# Patient Record
Sex: Female | Born: 1985 | Hispanic: No | Marital: Married | Smoking: Never smoker
Health system: Southern US, Community
[De-identification: ages and names within clinical notes are randomized; demographics above are authoritative.]

## PROBLEM LIST (undated history)

## (undated) ENCOUNTER — Inpatient Hospital Stay (HOSPITAL_COMMUNITY): Payer: Self-pay

## (undated) DIAGNOSIS — Z789 Other specified health status: Secondary | ICD-10-CM

## (undated) DIAGNOSIS — Z0282 Encounter for adoption services: Secondary | ICD-10-CM

## (undated) DIAGNOSIS — R112 Nausea with vomiting, unspecified: Secondary | ICD-10-CM

## (undated) DIAGNOSIS — Z9889 Other specified postprocedural states: Secondary | ICD-10-CM

## (undated) DIAGNOSIS — M1990 Unspecified osteoarthritis, unspecified site: Secondary | ICD-10-CM

## (undated) DIAGNOSIS — Z973 Presence of spectacles and contact lenses: Secondary | ICD-10-CM

## (undated) DIAGNOSIS — J302 Other seasonal allergic rhinitis: Secondary | ICD-10-CM

## (undated) DIAGNOSIS — M654 Radial styloid tenosynovitis [de Quervain]: Secondary | ICD-10-CM

## (undated) DIAGNOSIS — Z8759 Personal history of other complications of pregnancy, childbirth and the puerperium: Secondary | ICD-10-CM

## (undated) HISTORY — PX: ANKLE SURGERY: SHX546

---
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
## 2009-08-26 ENCOUNTER — Encounter: Admission: RE | Admit: 2009-08-26 | Discharge: 2009-10-01 | Payer: Self-pay | Admitting: Orthopedic Surgery

## 2009-12-12 ENCOUNTER — Emergency Department (HOSPITAL_COMMUNITY): Admission: EM | Admit: 2009-12-12 | Discharge: 2009-12-12 | Payer: Self-pay | Admitting: Emergency Medicine

## 2009-12-17 ENCOUNTER — Encounter: Admission: RE | Admit: 2009-12-17 | Discharge: 2009-12-17 | Payer: Self-pay | Admitting: Specialist

## 2009-12-19 HISTORY — PX: KNEE ARTHROSCOPY W/ MENISCECTOMY: SHX1879

## 2009-12-21 ENCOUNTER — Encounter: Admission: RE | Admit: 2009-12-21 | Discharge: 2010-01-29 | Payer: Self-pay | Admitting: Specialist

## 2011-05-06 NOTE — Op Note (Signed)
NAME:  Christina Tucker, Christina Tucker           ACCOUNT NO.:  [id number]   MEDICAL RECORD NO.:  [id number]          PATIENT TYPE:  AMB   LOCATION:  DAY                          FACILITY:  [facility]   PHYSICIAN:  Jene Every, M.D.    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   OPERATION PERFORMED:  Lateral recess decompression, microdiskectomy, L5,  foraminotomy, L5.   SURGEON:  Jene Every, M.D.   ASSISTANT:  Roma Schanz, P.A.   ANESTHESIA:  General.   INDICATIONS FOR PROCEDURE:  The patient is a 25 year old with a long history  of chronic left lower extremity radicular pain recently worse.  Extensor  hallucis longus weakness, positive neurotension signs preoperatively in the  holding room.  MRI indicated lateral disk protrusion at L4-5 associated  lateral recess stenosis compressing the L5 nerve root, associated disk  degeneration was noted as well.  The patient has undergone epidural  steroid  injections, activity modification, analgesics and was disabled by her  radicular symptoms.  I decided therefore after a thoughtful consultation  with the family who elected to proceed with decompression of 4-5 and  microdiskectomy, foraminotomy of L5.  We did discuss risks and benefits  thoroughly though including bleeding, infection, damage to neurovascular  structures, CSF leakage, epidural fibrosis, __________, need for fusion in  the future, anesthetic complications, etc.   DESCRIPTION OF PROCEDURE:  Patient placed in supine position.  After the  administration of general anesthesia and 1 g of Kefzol, she was placed prone  on the Andrews frame and all bony prominences well padded.  The lumbar  region was prepped and draped  in the usual sterile fashion.  Two 18-gauge  spinal needles were utilized to localize the 4-5 interspace, confirmed with  x-ray.  The patient has a moderate amount of subcutaneous adipose tissue.  I  identified the spinous process of 4 and 5.  I incised it with  electrocautery, elevated the paraspinous muscles from the lamina of 4 and 5,  placed a McCullough retractor and a Penfield 4 in the interlaminar space and  confirmed this with x-ray.  Operating microscope was then draped and brought  into the surgical field.  Hemilaminotomy of the caudad edge of 4 was  performed with 2 and 3 mm Kerrison, expanding that with a high speed bur.  The ligamentum flavum detached from the cephalad edge of 5 with a straight  curet and then a 2 mm Kerrison.  Foraminotomy of L5 was then performed.  Ligamentum flavum removed from the interspace with significant lateral  recess compression multifactorial.  There was also a significant  hypertrophic vascular release tethering the fiber as well.  The release was  then cauterized and divided.  Focal herniated nucleus pulposus was noted  with the neural elements well protected.  I performed an annulotomy and  removed copious sources of disk material  from the disk space.  Further  mobilized it with an Epstein and a hockey stick probe and performed a  diskectomy of all herniated material.  A spur on the posterior aspect of 4  and 5 was noted.  Disk space narrowing was noted on the radiograph.  I  placed a hockey stick probe in the foramen of 4 and 5 and found them to be  widely patent.  Good excursion of the root at least 1 cm medial to the  pedicle was noted.  Next, wound copiously irrigated and the disk space  copiously irrigated with antibiotic irrigation.  Inspection revealed no  evidence of CSF leakage or active bleeding.  McCullough retractor was  removed.  Paraspinous muscles were inspected, no evidence of active  bleeding.  Dorsal lumbar fascia reapproximated  with #1 Vicryl in interrupted  figure-of-eight sutures.  Subcutaneous tissue reapproximated with 2-0 Vicryl  simple sutures.  Skin was reapproximated with 4-0 subcuticular Prolene.  Wound was reinforced with Steri-Strips, sterile dressing applied.  Placed  supine on hospital bed, extubated without difficulty and transported to  recovery room in satisfactory condition.  The patient tolerated the  procedure well without complication.     Trey Paula   JB/MEDQ  D:  12/23/2004  T:  12/23/2004  Job:  [id number]

## 2011-12-15 ENCOUNTER — Emergency Department (HOSPITAL_BASED_OUTPATIENT_CLINIC_OR_DEPARTMENT_OTHER)
Admission: EM | Admit: 2011-12-15 | Discharge: 2011-12-15 | Disposition: A | Discharge status: Home / Self Care | Payer: 59 | Attending: Emergency Medicine | Admitting: Emergency Medicine

## 2011-12-15 ENCOUNTER — Emergency Department (INDEPENDENT_AMBULATORY_CARE_PROVIDER_SITE_OTHER): Payer: 59

## 2011-12-15 ENCOUNTER — Encounter: Payer: Self-pay | Admitting: *Deleted

## 2011-12-15 DIAGNOSIS — R05 Cough: Secondary | ICD-10-CM

## 2011-12-15 DIAGNOSIS — Z79899 Other long term (current) drug therapy: Secondary | ICD-10-CM | POA: Insufficient documentation

## 2011-12-15 DIAGNOSIS — J069 Acute upper respiratory infection, unspecified: Secondary | ICD-10-CM | POA: Insufficient documentation

## 2011-12-15 DIAGNOSIS — J45909 Unspecified asthma, uncomplicated: Secondary | ICD-10-CM | POA: Insufficient documentation

## 2011-12-15 MED ORDER — AZITHROMYCIN 250 MG PO TABS: 250.0000 mg | ORAL_TABLET | Freq: Every day | ORAL | Status: AC

## 2011-12-15 MED ORDER — ALBUTEROL SULFATE (5 MG/ML) 0.5% IN NEBU
INHALATION_SOLUTION | RESPIRATORY_TRACT | Status: AC
  Filled 2011-12-15: qty 1

## 2011-12-15 MED ORDER — ALBUTEROL SULFATE (5 MG/ML) 0.5% IN NEBU
5.0000 mg | INHALATION_SOLUTION | Freq: Once | RESPIRATORY_TRACT | Status: AC
  Administered 2011-12-15: 5 mg via RESPIRATORY_TRACT
  Filled 2011-12-15: qty 1

## 2011-12-15 MED ORDER — IPRATROPIUM BROMIDE 0.02 % IN SOLN
RESPIRATORY_TRACT | Status: AC
  Filled 2011-12-15: qty 2.5

## 2011-12-15 NOTE — ED Provider Notes (Signed)
History     CSN: [id number]  Arrival date & time 12/15/11  1035   First MD Initiated Contact with Patient 12/15/11 1148      Chief Complaint  Patient presents with  . Cough  . Nasal Congestion    (Consider location/radiation/quality/duration/timing/severity/associated sxs/prior treatment) Patient is a 25 y.o. female presenting with cough. The history is provided by the patient.  Cough This is a new problem. The current episode started more than 1 week ago. The problem occurs constantly. The problem has been gradually worsening. The cough is productive of sputum. There has been no fever. Pertinent negatives include no chest pain and no ear congestion. She is not a smoker. Her past medical history is significant for asthma.    Past Medical History  Diagnosis Date  . Asthma     History reviewed. No pertinent past surgical history.  History reviewed. No pertinent family history.  History  Substance Use Topics  . Smoking status: Never Smoker   . Smokeless tobacco: Not on file  . Alcohol Use: Yes    OB History    Grav Para Term Preterm Abortions TAB SAB Ect Mult Living                  Review of Systems  Respiratory: Positive for cough.   Cardiovascular: Negative for chest pain.  All other systems reviewed and are negative.    Allergies  Imitrex  Home Medications   Current Outpatient Rx  Name Route Sig Dispense Refill  . BUDESONIDE-FORMOTEROL FUMARATE 160-4.5 MCG/ACT IN AERO Inhalation Inhale 2 puffs into the lungs 2 (two) times daily.      . DROSPIRENONE-ETHINYL ESTRADIOL 3-0.02 MG PO TABS Oral Take 1 tablet by mouth daily.      Marland Kitchen ELETRIPTAN HYDROBROMIDE 20 MG PO TABS Oral One tablet by mouth as needed for migraine headache.  If the headache improves and then returns, dose may be repeated after 2 hours have elapsed since first dose (do not exceed 80 mg per day). may repeat in 2 hours if necessary     . METHOCARBAMOL 750 MG PO TABS Oral Take 750 mg by mouth 4  (four) times daily.      Marland Kitchen MONTELUKAST SODIUM 10 MG PO TABS Oral Take 10 mg by mouth at bedtime.      Marland Kitchen PREDNISONE (PAK) 10 MG PO TABS Oral Take 10 mg by mouth daily.      Marland Kitchen TAPENTADOL HCL 50 MG PO TABS Oral Take 100 mg by mouth every 6 (six) hours as needed.        BP 150/106  Pulse 95  Temp(Src) 98.9 F (37.2 C) (Oral)  Resp 20  Ht 5\' 2"  (1.575 m)  Wt 210 lb (95.255 kg)  BMI 38.41 kg/m2  SpO2 99%  LMP 11/24/2011  Physical Exam  Nursing note and vitals reviewed. Constitutional: She is oriented to person, place, and time. She appears well-developed and well-nourished. No distress.  HENT:  Head: Normocephalic and atraumatic.  Neck: Normal range of motion. Neck supple.  Cardiovascular: Normal rate and regular rhythm.  Exam reveals no gallop and no friction rub.   No murmur heard. Pulmonary/Chest: Effort normal and breath sounds normal. No respiratory distress.       Scattered wheezes bilaterally.  Abdominal: Soft. Bowel sounds are normal. She exhibits no distension. There is no tenderness.  Musculoskeletal: Normal range of motion.  Neurological: She is alert and oriented to person, place, and time.  Skin: Skin is warm and  dry. She is not diaphoretic.    ED Course  Procedures (including critical care time)  Labs Reviewed - No data to display No results found.   No diagnosis found.    MDM  Chest looks clear on xray.  Will treat with antibiotics due to length of illness.         Geoffery Lyons, MD 12/15/11 [phone]

## 2011-12-15 NOTE — ED Notes (Signed)
Pt seen by her pcp on Friday, called them today and said she wasn't feeling better, pt states she was told to come to ed for eval.

## 2011-12-15 NOTE — ED Notes (Signed)
Pt amb to room 6 with quick steady gait in nad. Pt reports cough x 1 week, denies any fevers or sob.

## 2012-08-03 IMAGING — CR DG CHEST 2V
2 series · 2 of 2 positions shown · non-contrast
Comparison: None.

CLINICAL DATA: Cough.

CHEST - 2 VIEW

[w chest pa]
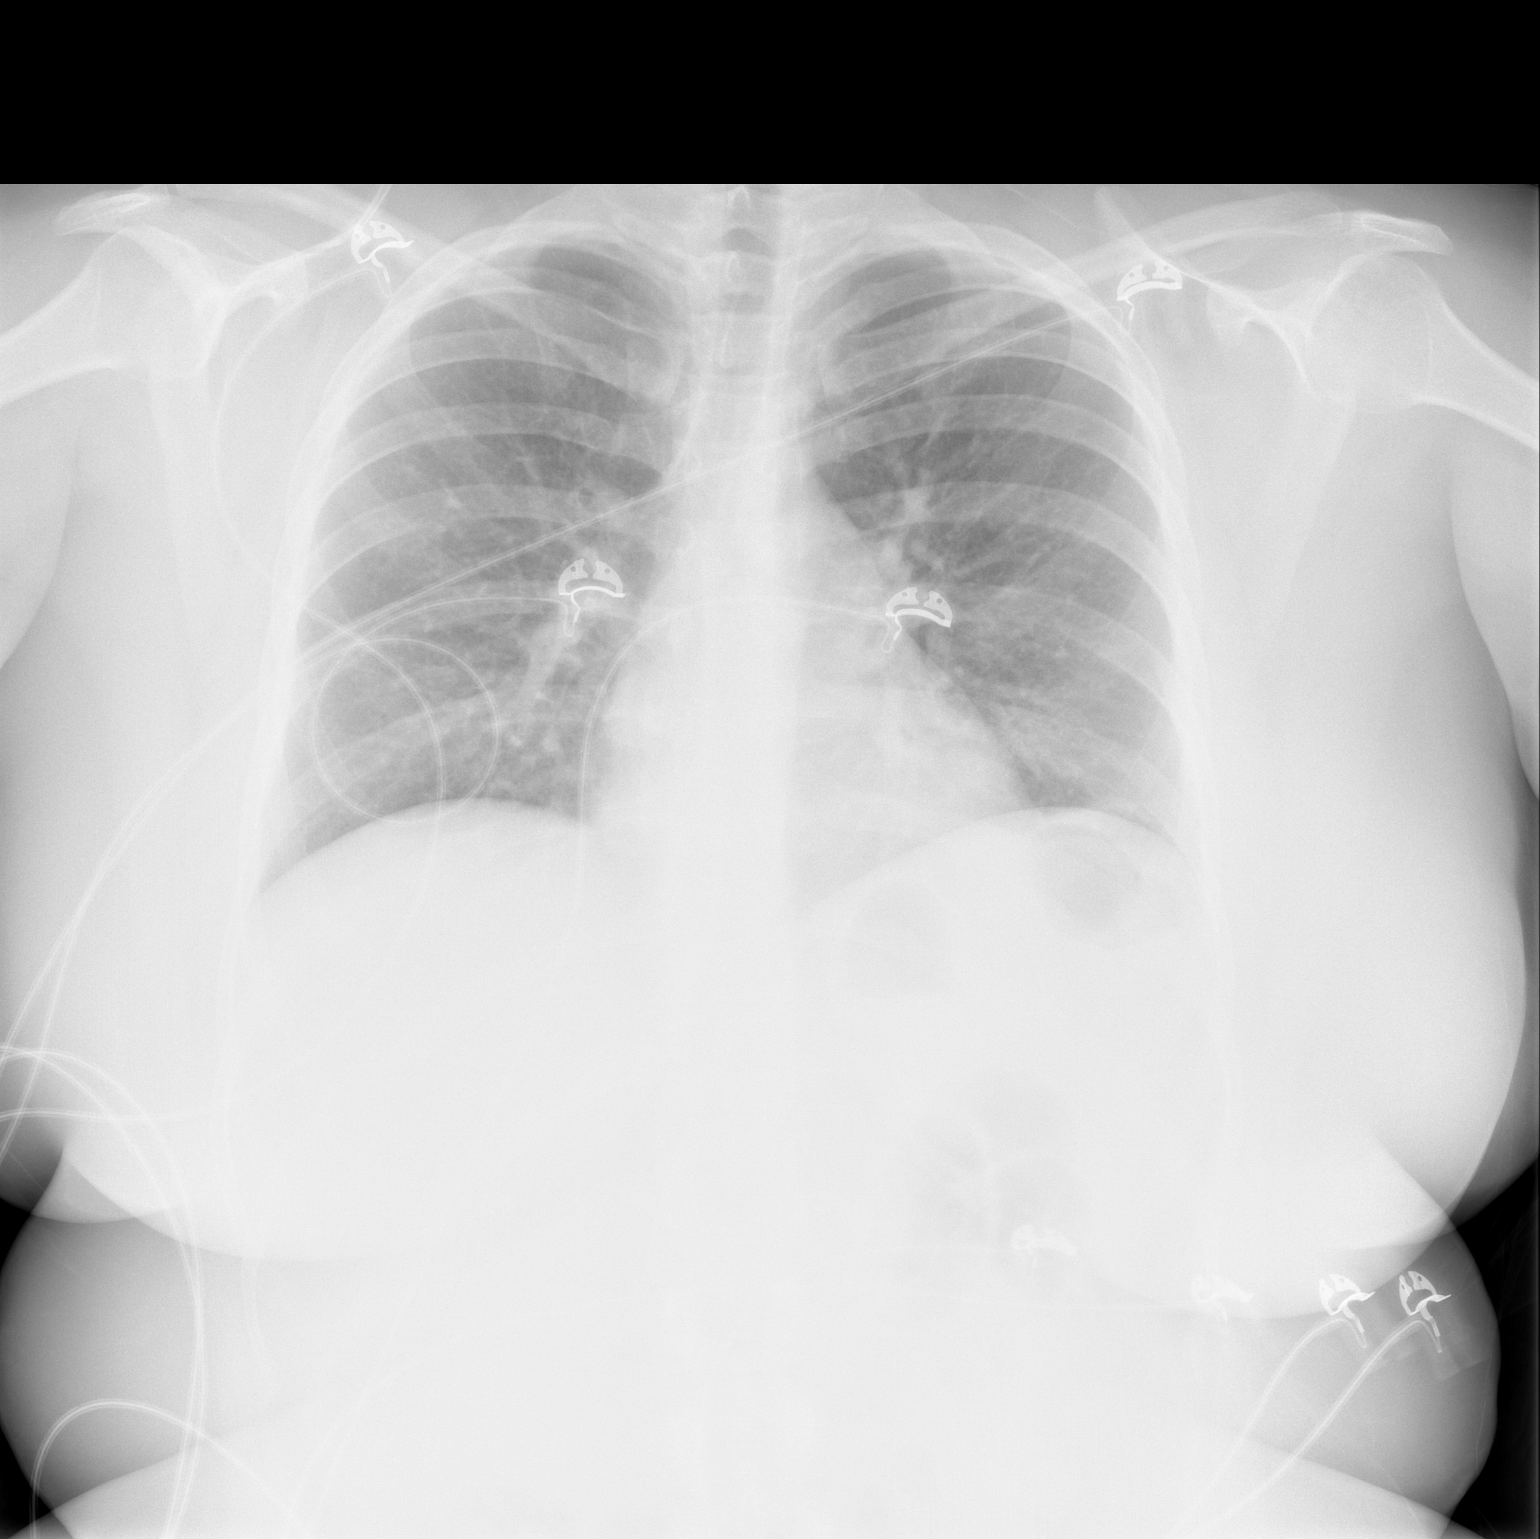

[w chest lat *]
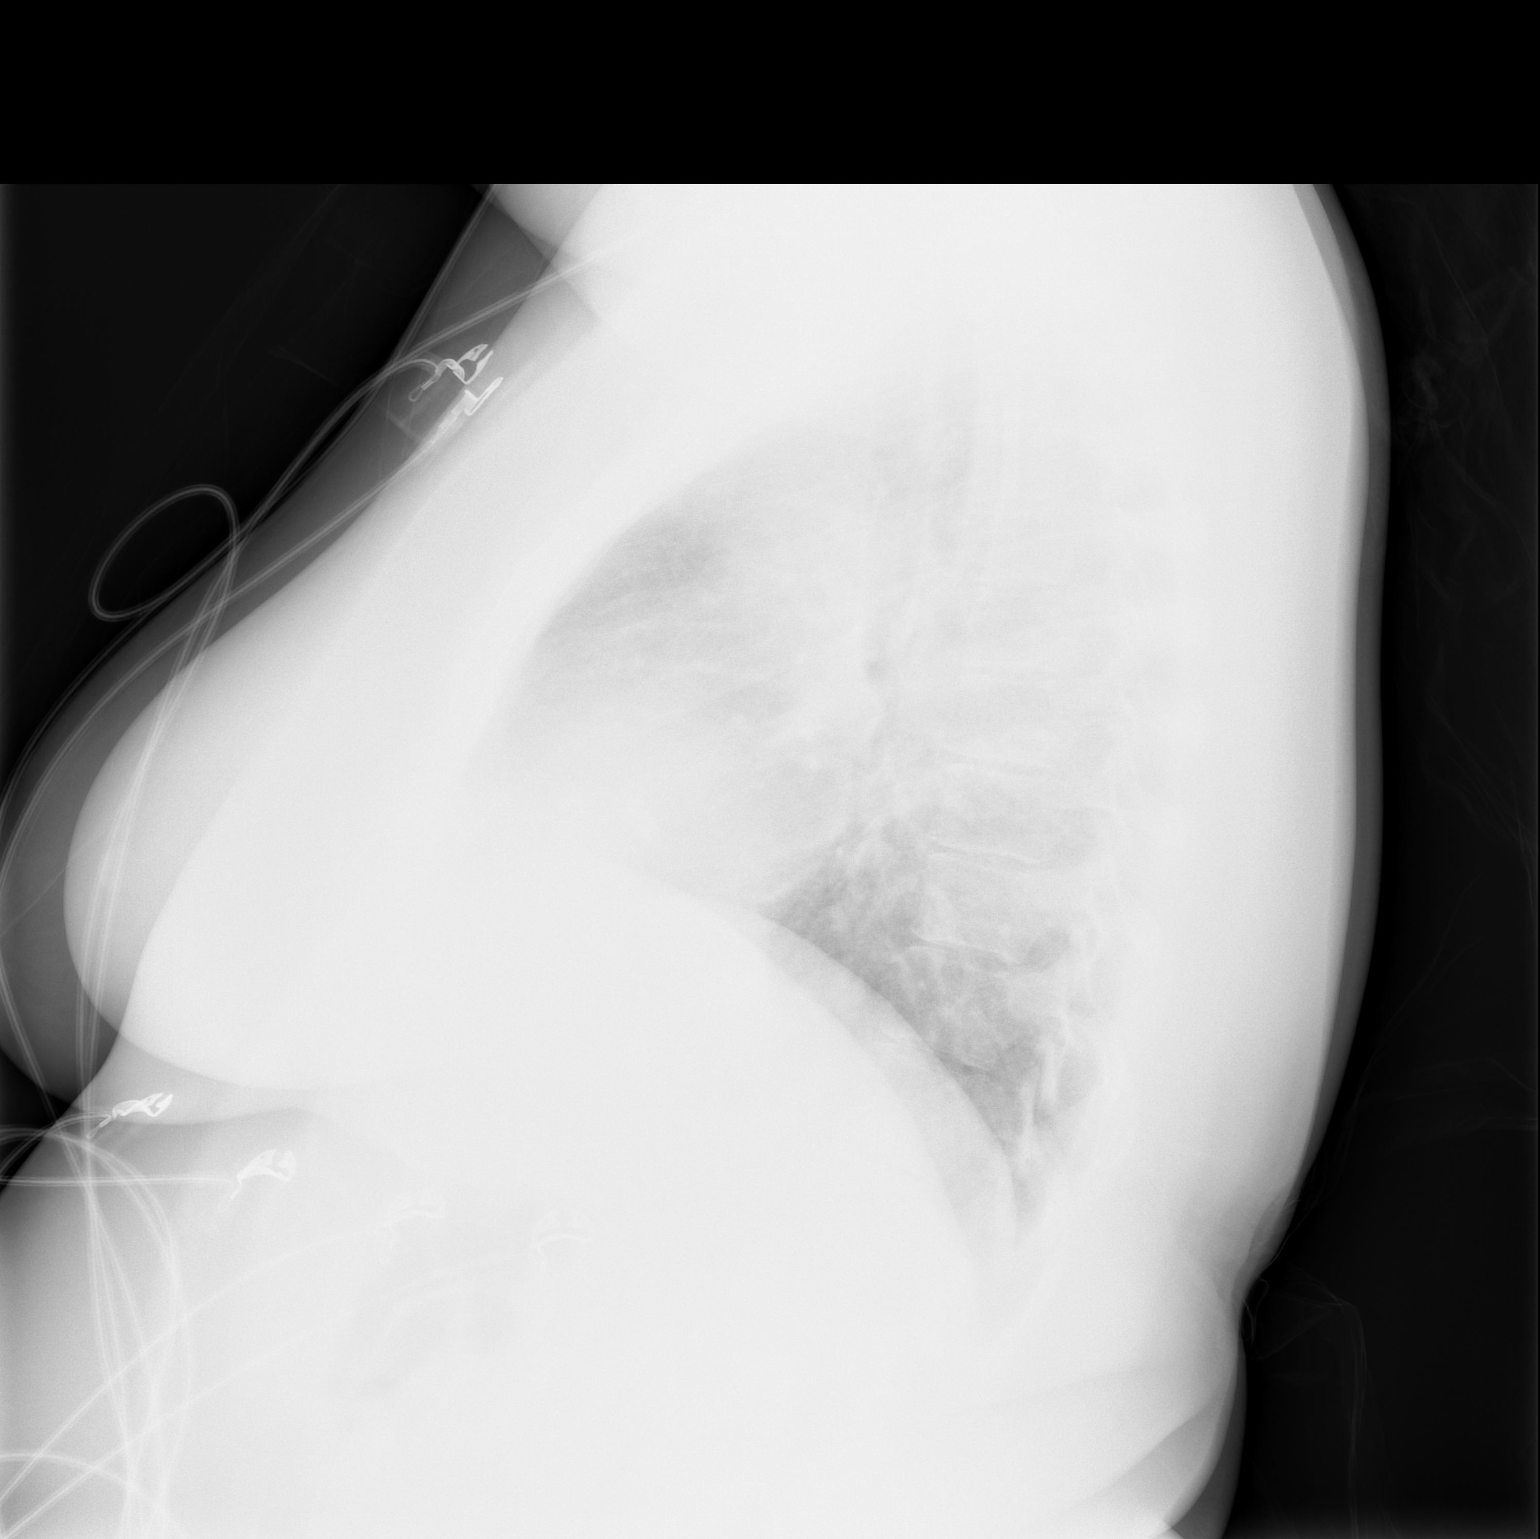

[2 of 2 positions shown; findings below may reference images not displayed]

FINDINGS: The heart size and mediastinal contours are within
normal limits.  Both lungs are clear.  The lung volumes are low.
The visualized skeletal structures are unremarkable.
IMPRESSION: 1.  Low lung volumes.
2.  No acute cardiopulmonary disease.

## 2016-02-22 ENCOUNTER — Other Ambulatory Visit: Payer: Self-pay | Admitting: Obstetrics and Gynecology

## 2016-02-23 LAB — CYTOLOGY - PAP

## 2016-03-22 LAB — OB RESULTS CONSOLE HEPATITIS B SURFACE ANTIGEN: Hepatitis B Surface Ag: NEGATIVE

## 2016-03-22 LAB — OB RESULTS CONSOLE RUBELLA ANTIBODY, IGM: RUBELLA: IMMUNE

## 2016-03-22 LAB — OB RESULTS CONSOLE RPR: RPR: NONREACTIVE

## 2016-03-22 LAB — OB RESULTS CONSOLE ANTIBODY SCREEN: Antibody Screen: NEGATIVE

## 2016-03-22 LAB — OB RESULTS CONSOLE GC/CHLAMYDIA
Chlamydia: NEGATIVE
Gonorrhea: NEGATIVE

## 2016-03-22 LAB — OB RESULTS CONSOLE ABO/RH: RH TYPE: POSITIVE

## 2016-03-22 LAB — OB RESULTS CONSOLE HIV ANTIBODY (ROUTINE TESTING): HIV: NONREACTIVE

## 2016-06-26 ENCOUNTER — Encounter (HOSPITAL_COMMUNITY): Payer: Self-pay | Admitting: Certified Nurse Midwife

## 2016-06-26 ENCOUNTER — Inpatient Hospital Stay (HOSPITAL_COMMUNITY)
Admission: AD | Admit: 2016-06-26 | Discharge: 2016-06-26 | Disposition: A | Discharge status: Home / Self Care | Payer: 59 | Source: Ambulatory Visit | Attending: Obstetrics and Gynecology | Admitting: Obstetrics and Gynecology

## 2016-06-26 DIAGNOSIS — O4692 Antepartum hemorrhage, unspecified, second trimester: Secondary | ICD-10-CM | POA: Insufficient documentation

## 2016-06-26 DIAGNOSIS — J45909 Unspecified asthma, uncomplicated: Secondary | ICD-10-CM | POA: Insufficient documentation

## 2016-06-26 DIAGNOSIS — O99512 Diseases of the respiratory system complicating pregnancy, second trimester: Secondary | ICD-10-CM | POA: Diagnosis not present

## 2016-06-26 DIAGNOSIS — R319 Hematuria, unspecified: Secondary | ICD-10-CM | POA: Diagnosis not present

## 2016-06-26 DIAGNOSIS — R82998 Other abnormal findings in urine: Secondary | ICD-10-CM

## 2016-06-26 DIAGNOSIS — Z3A25 25 weeks gestation of pregnancy: Secondary | ICD-10-CM | POA: Insufficient documentation

## 2016-06-26 DIAGNOSIS — O26892 Other specified pregnancy related conditions, second trimester: Secondary | ICD-10-CM | POA: Insufficient documentation

## 2016-06-26 DIAGNOSIS — N939 Abnormal uterine and vaginal bleeding, unspecified: Secondary | ICD-10-CM | POA: Diagnosis present

## 2016-06-26 LAB — URINALYSIS, ROUTINE W REFLEX MICROSCOPIC
BILIRUBIN URINE: NEGATIVE
Glucose, UA: NEGATIVE mg/dL
KETONES UR: NEGATIVE mg/dL
NITRITE: NEGATIVE
PROTEIN: NEGATIVE mg/dL
Specific Gravity, Urine: <1.005 — ABNORMAL LOW (ref 1.005–1.030)
pH: 7 (ref 5.0–8.0)

## 2016-06-26 LAB — URINE MICROSCOPIC-ADD ON: Bacteria, UA: NONE SEEN

## 2016-06-26 NOTE — MAU Provider Note (Signed)
Chief Complaint:  Vaginal Bleeding   First Provider Initiated Contact with Patient 06/26/16 1354      HPI: Christina Tucker is a 30 y.o. G1P0 at 525w0dwho presents to maternity admissions reporting pink vaginal discharge seen at home.  Was worried.  States has been there several days. Denies contractions or pain. . She reports good fetal movement, denies LOF,  vaginal itching/burning, urinary symptoms, h/a, dizziness, n/v, diarrhea, constipation or fever/chills.  She denies headache, visual changes or RUQ abdominal pain.   Vaginal Bleeding The patient's primary symptoms include vaginal bleeding. The patient's pertinent negatives include no genital itching, genital lesions, genital odor or pelvic pain. This is a new problem. The current episode started in the past 7 days. The problem occurs intermittently. The patient is experiencing no pain. She is pregnant. Pertinent negatives include no abdominal pain, back pain, chills, constipation, diarrhea, dysuria, fever, nausea or vomiting. Vaginal discharge characteristics: pink. The vaginal bleeding is spotting. She has not been passing clots. She has not been passing tissue. Nothing aggravates the symptoms. She has tried nothing for the symptoms. She uses nothing for contraception.  RN Note:  Pt states she has been having brownish pink discharge for several days. Pt denies LOF or ctxs. Fetus active.          Past Medical History: Past Medical History  Diagnosis Date  . Asthma     Past obstetric history: OB History  Gravida Para Term Preterm AB SAB TAB Ectopic Multiple Living  1             # Outcome Date GA Lbr Len/2nd Weight Sex Delivery Anes PTL Lv  1 Current               Past Surgical History: Past Surgical History  Procedure Laterality Date  . Appendectomy    . Back surgery    . Tonsillectomy      Family History: History reviewed. No pertinent family history.  Social History: Social History  Substance Use Topics  .  Smoking status: Never Smoker   . Smokeless tobacco: None  . Alcohol Use: No    Allergies:  Allergies  Allergen Reactions  . Banana Swelling  . Imitrex [Sumatriptan Base] Swelling    Bumps on tongue  . Morphine And Related     Does not work  . Nucynta [Tapentadol] Itching    Meds:  Prescriptions prior to admission  Medication Sig Dispense Refill Last Dose  . Prenatal Vit-Fe Fumarate-FA (MULTIVITAMIN-PRENATAL) 27-0.8 MG TABS tablet Take 1 tablet by mouth daily at 12 noon.   06/25/2016 at 10 pm    I have reviewed patient's Past Medical Hx, Surgical Hx, Family Hx, Social Hx, medications and allergies.   ROS:  Review of Systems  Constitutional: Negative for fever and chills.  Respiratory: Negative for shortness of breath.   Gastrointestinal: Negative for nausea, vomiting, abdominal pain, diarrhea and constipation.  Genitourinary: Positive for vaginal bleeding. Negative for dysuria and pelvic pain.  Musculoskeletal: Negative for back pain.   Other systems negative  Physical Exam  Patient Vitals for the past 24 hrs:  BP Temp Temp src Pulse Resp  06/26/16 1326 125/78 mmHg 98.3 F (36.8 C) Oral 89 20   Constitutional: Well-developed, well-nourished female in no acute distress.  Cardiovascular: normal rate and rhythm Respiratory: normal effort, clear to auscultation bilaterally GI: Abd soft, non-tender, gravid appropriate for gestational age.   No rebound or guarding. MS: Extremities nontender, no edema, normal ROM Neurologic: Alert and oriented x  4.  GU: Neg CVAT.  PELVIC EXAM: Cervix pink, visually closed, without lesion, scant white creamy discharge, no blood seen,  vaginal walls and external genitalia normal Bimanual exam: Cervix firm, posterior, neg CMT, uterus nontender, Fundal Height consistent with dates, adnexa without tenderness, enlargement, or mass  Dilation: Closed Effacement (%): 30 Exam by:: Artelia Laroche CNM  FHT:  Baseline 140 , moderate variability,  accelerations present, no decelerations Contractions: none   Labs: Results for orders placed or performed during the hospital encounter of 06/26/16 (from the past 72 hour(s))  Urinalysis, Routine w reflex microscopic (not at [facility])     Status: Abnormal   Collection Time: 06/26/16  1:10 PM  Result Value Ref Range   Color, Urine YELLOW YELLOW   APPearance CLEAR CLEAR   Specific Gravity, Urine <1.005 (L) 1.005 - 1.030   pH 7.0 5.0 - 8.0   Glucose, UA NEGATIVE NEGATIVE mg/dL   Hgb urine dipstick TRACE (A) NEGATIVE   Bilirubin Urine NEGATIVE NEGATIVE   Ketones, ur NEGATIVE NEGATIVE mg/dL   Protein, ur NEGATIVE NEGATIVE mg/dL   Nitrite NEGATIVE NEGATIVE   Leukocytes, UA TRACE (A) NEGATIVE  Urine microscopic-add on     Status: Abnormal   Collection Time: 06/26/16  1:10 PM  Result Value Ref Range   Squamous Epithelial / LPF 0-5 (A) NONE SEEN   WBC, UA 0-5 0 - 5 WBC/hpf   RBC / HPF 0-5 0 - 5 RBC/hpf   Bacteria, UA NONE SEEN NONE SEEN  Culture, OB Urine     Status: Abnormal   Collection Time: 06/26/16  1:10 PM  Result Value Ref Range   Specimen Description OB CLEAN CATCH    Special Requests Normal    Culture (A)     MULTIPLE SPECIES PRESENT, SUGGEST RECOLLECTION NO GROUP B STREP (S.AGALACTIAE) ISOLATED Performed at [facility]    Report Status 06/27/2016 FINAL     Imaging:  No results found.  MAU Course/MDM: I have ordered labs and reviewed results.  NST reviewed Consult Dr Claiborne Billings with presentation, exam findings and test results.  She recommends reassurance and discharge home Patient to report further contractions or bleeding to office  Assessment: SIUP at [redacted]w[redacted]d  Pink spotting at home, none on exam Trace hematuria  Plan: Discharge home Urine to culture Reassured Pelvic rest for now Preterm Labor precautions and fetal kick counts Follow up in Office for prenatal visits and recheck of status    Medication List    ASK your doctor about these medications         multivitamin-prenatal 27-0.8 MG Tabs tablet  Take 1 tablet by mouth daily at 12 noon.       Pt stable at time of discharge.  Encouraged to return here or to other Urgent Care/ED if she develops worsening of symptoms, increase in pain, fever, or other concerning symptoms.      Wynelle Bourgeois CNM, MSN Certified Nurse-Midwife 06/26/2016 1:55 PM

## 2016-06-26 NOTE — MAU Note (Signed)
Pt states she has been having brownish pink discharge for several days. Pt denies LOF or ctxs. Fetus active.

## 2016-06-26 NOTE — Discharge Instructions (Signed)

## 2016-06-27 LAB — CULTURE, OB URINE: Special Requests: NORMAL

## 2016-07-11 ENCOUNTER — Other Ambulatory Visit: Payer: Self-pay | Admitting: Obstetrics & Gynecology

## 2016-07-14 ENCOUNTER — Encounter (HOSPITAL_COMMUNITY): Payer: Self-pay | Admitting: *Deleted

## 2016-07-14 ENCOUNTER — Inpatient Hospital Stay (HOSPITAL_COMMUNITY)
Admission: AD | Admit: 2016-07-14 | Discharge: 2016-07-14 | Disposition: A | Discharge status: Home / Self Care | Payer: 59 | Source: Ambulatory Visit | Attending: Obstetrics and Gynecology | Admitting: Obstetrics and Gynecology

## 2016-07-14 DIAGNOSIS — N898 Other specified noninflammatory disorders of vagina: Secondary | ICD-10-CM | POA: Insufficient documentation

## 2016-07-14 DIAGNOSIS — Z885 Allergy status to narcotic agent status: Secondary | ICD-10-CM | POA: Insufficient documentation

## 2016-07-14 DIAGNOSIS — J45909 Unspecified asthma, uncomplicated: Secondary | ICD-10-CM | POA: Insufficient documentation

## 2016-07-14 DIAGNOSIS — O26893 Other specified pregnancy related conditions, third trimester: Secondary | ICD-10-CM | POA: Diagnosis not present

## 2016-07-14 DIAGNOSIS — Z888 Allergy status to other drugs, medicaments and biological substances status: Secondary | ICD-10-CM | POA: Insufficient documentation

## 2016-07-14 DIAGNOSIS — R109 Unspecified abdominal pain: Secondary | ICD-10-CM | POA: Diagnosis not present

## 2016-07-14 DIAGNOSIS — Z3A27 27 weeks gestation of pregnancy: Secondary | ICD-10-CM | POA: Insufficient documentation

## 2016-07-14 DIAGNOSIS — O26899 Other specified pregnancy related conditions, unspecified trimester: Secondary | ICD-10-CM

## 2016-07-14 DIAGNOSIS — O9989 Other specified diseases and conditions complicating pregnancy, childbirth and the puerperium: Secondary | ICD-10-CM | POA: Diagnosis not present

## 2016-07-14 LAB — URINALYSIS, ROUTINE W REFLEX MICROSCOPIC
BILIRUBIN URINE: NEGATIVE
GLUCOSE, UA: NEGATIVE mg/dL
KETONES UR: NEGATIVE mg/dL
Nitrite: NEGATIVE
PH: 5.5 (ref 5.0–8.0)
PROTEIN: NEGATIVE mg/dL
Specific Gravity, Urine: <1.005 — ABNORMAL LOW (ref 1.005–1.030)

## 2016-07-14 LAB — URINE MICROSCOPIC-ADD ON

## 2016-07-14 NOTE — MAU Note (Signed)
Pt states she had been feeling "uncontrollable fetal movement" for the past few days.  Episodes are intermittent, lasting around 15-30 seconds.  Feels very different from previous fetal movement.  Was adivsed to come to MAU by MD office.  Has some abd pain after these episodes, is very sore.  Denies bleeding or LOF.

## 2016-07-14 NOTE — Discharge Instructions (Signed)
Second Trimester of Pregnancy  The second trimester is from week 13 through week 28, month 4 through 6. This is often the time in pregnancy that you feel your best. Often times, morning sickness has lessened or quit. You may have more energy, and you may get hungry more often. Your unborn baby (fetus) is growing rapidly. At the end of the sixth month, he or she is about 9 inches long and weighs about 1½ pounds. You will likely feel the baby move (quickening) between 18 and 20 weeks of pregnancy.  HOME CARE   · Avoid all smoking, herbs, and alcohol. Avoid drugs not approved by your doctor.  · Do not use any tobacco products, including cigarettes, chewing tobacco, and electronic cigarettes. If you need help quitting, ask your doctor. You may get counseling or other support to help you quit.  · Only take medicine as told by your doctor. Some medicines are safe and some are not during pregnancy.  · Exercise only as told by your doctor. Stop exercising if you start having cramps.  · Eat regular, healthy meals.  · Wear a good support bra if your breasts are tender.  · Do not use hot tubs, steam rooms, or saunas.  · Wear your seat belt when driving.  · Avoid raw meat, uncooked cheese, and liter boxes and soil used by cats.  · Take your prenatal vitamins.  · Take 1500-2000 milligrams of calcium daily starting at the 20th week of pregnancy until you deliver your baby.  · Try taking medicine that helps you poop (stool softener) as needed, and if your doctor approves. Eat more fiber by eating fresh fruit, vegetables, and whole grains. Drink enough fluids to keep your pee (urine) clear or pale yellow.  · Take warm water baths (sitz baths) to soothe pain or discomfort caused by hemorrhoids. Use hemorrhoid cream if your doctor approves.  · If you have puffy, bulging veins (varicose veins), wear support hose. Raise (elevate) your feet for 15 minutes, 3-4 times a day. Limit salt in your diet.  · Avoid heavy lifting, wear low heals,  and sit up straight.  · Rest with your legs raised if you have leg cramps or low back pain.  · Visit your dentist if you have not gone during your pregnancy. Use a soft toothbrush to brush your teeth. Be gentle when you floss.  · You can have sex (intercourse) unless your doctor tells you not to.  · Go to your doctor visits.  GET HELP IF:   · You feel dizzy.  · You have mild cramps or pressure in your lower belly (abdomen).  · You have a nagging pain in your belly area.  · You continue to feel sick to your stomach (nauseous), throw up (vomit), or have watery poop (diarrhea).  · You have bad smelling fluid coming from your vagina.  · You have pain with peeing (urination).  GET HELP RIGHT AWAY IF:   · You have a fever.  · You are leaking fluid from your vagina.  · You have spotting or bleeding from your vagina.  · You have severe belly cramping or pain.  · You lose or gain weight rapidly.  · You have trouble catching your breath and have chest pain.  · You notice sudden or extreme puffiness (swelling) of your face, hands, ankles, feet, or legs.  · You have not felt the baby move in over an hour.  · You have severe headaches that do   not go away with medicine.  · You have vision changes.     This information is not intended to replace advice given to you by your health care provider. Make sure you discuss any questions you have with your health care provider.     Document Released: 03/01/2010 Document Revised: 12/26/2014 Document Reviewed: 02/05/2013  Elsevier Interactive Patient Education ©2016 [employer]

## 2016-07-14 NOTE — MAU Provider Note (Signed)
History     CSN: [id number]  Arrival date and time: 07/14/16 1734   None     Chief Complaint  Patient presents with  . fetal movement issues   HPI Christina Tucker is 30 y.o. G1P0 [redacted]w[redacted]d weeks presenting with concern about "abnormal" fetal movement.  She is a patient of Dr. Tenny Craw'. Began 1 week ago "where she goes crazy, with uncontrollable movements and then it will stop.  Patient states her abdomen is sore when the baby stops movement.  Neg for vaginal bleeding.  She was seen in the office 3 days ago with vaginal discharge.   Testing was negative for UA and vaginal sxs.      Past Medical History:  Diagnosis Date  . Asthma     Past Surgical History:  Procedure Laterality Date  . ANKLE SURGERY    . APPENDECTOMY    . BACK SURGERY    . KNEE SURGERY    . TONSILLECTOMY    . WISDOM TOOTH EXTRACTION      History reviewed. No pertinent family history.  Social History  Substance Use Topics  . Smoking status: Never Smoker  . Smokeless tobacco: Never Used  . Alcohol use No    Allergies:  Allergies  Allergen Reactions  . Banana Swelling  . Imitrex [Sumatriptan Base] Swelling    Bumps on tongue  . Morphine And Related     Does not work  . Nucynta [Tapentadol] Itching    Prescriptions Prior to Admission  Medication Sig Dispense Refill Last Dose  . Prenatal Vit-Fe Fumarate-FA (MULTIVITAMIN-PRENATAL) 27-0.8 MG TABS tablet Take 1 tablet by mouth daily at 12 noon.   06/25/2016 at 10 pm    Review of Systems  Constitutional: Negative for fever.  Gastrointestinal: Positive for abdominal pain (sore after baby is very active). Negative for nausea and vomiting.  Genitourinary: Negative for dysuria, frequency and urgency.       + for vaginal discharge, screened Monday in office. Neg.  Negative for vaginal bleeding.   Physical Exam   Blood pressure 141/74, pulse 101, temperature 98.3 F (36.8 C), temperature source Oral, resp. rate 18.  Physical Exam  Nursing note and vitals  reviewed. Constitutional: She is oriented to person, place, and time. She appears well-developed and well-nourished. No distress.  HENT:  Head: Normocephalic.  Neurological: She is alert and oriented to person, place, and time.  Psychiatric: She has a normal mood and affect. Her behavior is normal. Thought content normal.   Results for orders placed or performed during the hospital encounter of 07/14/16 (from the past 24 hour(s))  Urinalysis, Routine w reflex microscopic (not at [facility])     Status: Abnormal   Collection Time: 07/14/16  5:40 PM  Result Value Ref Range   Color, Urine STRAW (A) YELLOW   APPearance CLEAR CLEAR   Specific Gravity, Urine <1.005 (L) 1.005 - 1.030   pH 5.5 5.0 - 8.0   Glucose, UA NEGATIVE NEGATIVE mg/dL   Hgb urine dipstick SMALL (A) NEGATIVE   Bilirubin Urine NEGATIVE NEGATIVE   Ketones, ur NEGATIVE NEGATIVE mg/dL   Protein, ur NEGATIVE NEGATIVE mg/dL   Nitrite NEGATIVE NEGATIVE   Leukocytes, UA LARGE (A) NEGATIVE  Urine microscopic-add on     Status: Abnormal   Collection Time: 07/14/16  5:40 PM  Result Value Ref Range   Squamous Epithelial / LPF 6-30 (A) NONE SEEN   WBC, UA 6-30 0 - 5 WBC/hpf   RBC / HPF 0-5 0 - 5 RBC/hpf  Bacteria, UA MANY (A) NONE SEEN   MAU Course  Procedures Urine culture sent to lab  MDM MSE NST--baseline 135-140, mod variability, 10x10s noted Consulted with Dr. Henderson Cloud, reviewed patient's hx/HPI, NST. Order to discharge to home.   Assessment and Plan  A:  Active fetal movement      Abdominal soreness secondary to fetal movement      NST -reassuring  P:  Consulted with Dr. Henderson Cloud       Keep schedule appointment       Urine culture to lab         Schyler Butikofer,EVE M 07/14/2016, 6:51 PM

## 2016-07-17 LAB — CULTURE, OB URINE
Culture: >=100000 — AB
Special Requests: NORMAL

## 2016-07-20 ENCOUNTER — Other Ambulatory Visit: Payer: Self-pay | Admitting: Obstetrics & Gynecology

## 2016-09-07 ENCOUNTER — Other Ambulatory Visit: Payer: Self-pay | Admitting: Obstetrics and Gynecology

## 2016-09-07 LAB — OB RESULTS CONSOLE GBS: GBS: NEGATIVE

## 2016-09-19 ENCOUNTER — Inpatient Hospital Stay (HOSPITAL_COMMUNITY): Payer: 59 | Admitting: Anesthesiology

## 2016-09-19 ENCOUNTER — Inpatient Hospital Stay (HOSPITAL_COMMUNITY)
Admission: AD | Admit: 2016-09-19 | Discharge: 2016-09-22 | DRG: 766 | Disposition: A | Discharge status: Home / Self Care | Payer: 59 | Source: Ambulatory Visit | Attending: Obstetrics and Gynecology | Admitting: Obstetrics and Gynecology

## 2016-09-19 ENCOUNTER — Encounter (HOSPITAL_COMMUNITY): Payer: Self-pay | Admitting: Emergency Medicine

## 2016-09-19 DIAGNOSIS — O134 Gestational [pregnancy-induced] hypertension without significant proteinuria, complicating childbirth: Principal | ICD-10-CM | POA: Diagnosis present

## 2016-09-19 DIAGNOSIS — O34219 Maternal care for unspecified type scar from previous cesarean delivery: Secondary | ICD-10-CM

## 2016-09-19 DIAGNOSIS — Z3A37 37 weeks gestation of pregnancy: Secondary | ICD-10-CM

## 2016-09-19 DIAGNOSIS — Z9889 Other specified postprocedural states: Secondary | ICD-10-CM | POA: Diagnosis present

## 2016-09-19 DIAGNOSIS — J45909 Unspecified asthma, uncomplicated: Secondary | ICD-10-CM | POA: Diagnosis present

## 2016-09-19 DIAGNOSIS — O9952 Diseases of the respiratory system complicating childbirth: Secondary | ICD-10-CM | POA: Diagnosis present

## 2016-09-19 LAB — COMPREHENSIVE METABOLIC PANEL
ALBUMIN: 2.9 g/dL — AB (ref 3.5–5.0)
ALT: 12 U/L — ABNORMAL LOW (ref 14–54)
ANION GAP: 10 (ref 5–15)
AST: 19 U/L (ref 15–41)
Alkaline Phosphatase: 122 U/L (ref 38–126)
BUN: 10 mg/dL (ref 6–20)
CHLORIDE: 107 mmol/L (ref 101–111)
CO2: 19 mmol/L — ABNORMAL LOW (ref 22–32)
Calcium: 9.4 mg/dL (ref 8.9–10.3)
Creatinine, Ser: 0.42 mg/dL — ABNORMAL LOW (ref 0.44–1.00)
GFR calc Af Amer: >60 mL/min (ref >60)
GLUCOSE: 65 mg/dL (ref 65–99)
POTASSIUM: 4.2 mmol/L (ref 3.5–5.1)
Sodium: 136 mmol/L (ref 135–145)
Total Bilirubin: 0.5 mg/dL (ref 0.3–1.2)
Total Protein: 6.2 g/dL — ABNORMAL LOW (ref 6.5–8.1)

## 2016-09-19 LAB — CBC
HEMATOCRIT: 37.4 % (ref 36.0–46.0)
HEMATOCRIT: 38.4 % (ref 36.0–46.0)
HEMOGLOBIN: 13.2 g/dL (ref 12.0–15.0)
Hemoglobin: 13 g/dL (ref 12.0–15.0)
MCH: 30.9 pg (ref 26.0–34.0)
MCH: 30.8 pg (ref 26.0–34.0)
MCHC: 34.8 g/dL (ref 30.0–36.0)
MCHC: 34.4 g/dL (ref 30.0–36.0)
MCV: 88.8 fL (ref 78.0–100.0)
MCV: 89.5 fL (ref 78.0–100.0)
Platelets: 343 10*3/uL (ref 150–400)
Platelets: 335 10*3/uL (ref 150–400)
RBC: 4.21 MIL/uL (ref 3.87–5.11)
RBC: 4.29 MIL/uL (ref 3.87–5.11)
RDW: 13.6 % (ref 11.5–15.5)
RDW: 13.8 % (ref 11.5–15.5)
WBC: 11.2 10*3/uL — AB (ref 4.0–10.5)
WBC: 13.5 10*3/uL — ABNORMAL HIGH (ref 4.0–10.5)

## 2016-09-19 LAB — RPR: RPR: NONREACTIVE

## 2016-09-19 LAB — TYPE AND SCREEN
ABO/RH(D): O POS
Antibody Screen: NEGATIVE

## 2016-09-19 LAB — ABO/RH: ABO/RH(D): O POS

## 2016-09-19 MED ORDER — LIDOCAINE HCL (PF) 1 % IJ SOLN
INTRAMUSCULAR | Status: DC | PRN
  Administered 2016-09-19 (×2): 5 mL

## 2016-09-19 MED ORDER — FENTANYL 2.5 MCG/ML BUPIVACAINE 1/10 % EPIDURAL INFUSION (WH - ANES)
14.0000 mL/h | INTRAMUSCULAR | Status: DC | PRN
  Administered 2016-09-19 – 2016-09-20 (×2): 14 mL/h via EPIDURAL
  Filled 2016-09-19 (×3): qty 125

## 2016-09-19 MED ORDER — OXYTOCIN BOLUS FROM INFUSION: 500.0000 mL | Freq: Once | INTRAVENOUS | Status: DC

## 2016-09-19 MED ORDER — EPHEDRINE 5 MG/ML INJ: 10.0000 mg | INTRAVENOUS | Status: DC | PRN

## 2016-09-19 MED ORDER — OXYTOCIN 40 UNITS IN LACTATED RINGERS INFUSION - SIMPLE MED
1.0000 m[IU]/min | INTRAVENOUS | Status: DC
  Filled 2016-09-19: qty 1000

## 2016-09-19 MED ORDER — DIPHENHYDRAMINE HCL 50 MG/ML IJ SOLN: 12.5000 mg | INTRAMUSCULAR | Status: DC | PRN

## 2016-09-19 MED ORDER — LACTATED RINGERS IV SOLN
INTRAVENOUS | Status: DC
  Administered 2016-09-19: 125 mL/h via INTRAVENOUS
  Administered 2016-09-19: 125 mL via INTRAVENOUS
  Administered 2016-09-20: 09:00:00 via INTRAVENOUS

## 2016-09-19 MED ORDER — LIDOCAINE HCL (PF) 1 % IJ SOLN: 30.0000 mL | INTRAMUSCULAR | Status: DC | PRN

## 2016-09-19 MED ORDER — LACTATED RINGERS IV SOLN: 500.0000 mL | INTRAVENOUS | Status: DC | PRN

## 2016-09-19 MED ORDER — LACTATED RINGERS IV SOLN: 500.0000 mL | Freq: Once | INTRAVENOUS | Status: DC

## 2016-09-19 MED ORDER — ONDANSETRON HCL 4 MG/2ML IJ SOLN: 4.0000 mg | Freq: Four times a day (QID) | INTRAMUSCULAR | Status: DC | PRN

## 2016-09-19 MED ORDER — PHENYLEPHRINE 40 MCG/ML (10ML) SYRINGE FOR IV PUSH (FOR BLOOD PRESSURE SUPPORT)
80.0000 ug | PREFILLED_SYRINGE | INTRAVENOUS | Status: DC | PRN
  Filled 2016-09-19 (×2): qty 10

## 2016-09-19 MED ORDER — SOD CITRATE-CITRIC ACID 500-334 MG/5ML PO SOLN
30.0000 mL | ORAL | Status: DC | PRN
  Administered 2016-09-20: 30 mL via ORAL
  Filled 2016-09-19: qty 15

## 2016-09-19 MED ORDER — NALBUPHINE HCL 10 MG/ML IJ SOLN
5.0000 mg | INTRAMUSCULAR | Status: DC | PRN
  Administered 2016-09-19: 5 mg via INTRAVENOUS
  Filled 2016-09-19: qty 1

## 2016-09-19 MED ORDER — TERBUTALINE SULFATE 1 MG/ML IJ SOLN: 0.2500 mg | Freq: Once | INTRAMUSCULAR | Status: DC | PRN

## 2016-09-19 MED ORDER — BUTORPHANOL TARTRATE 1 MG/ML IJ SOLN
1.0000 mg | INTRAMUSCULAR | Status: DC | PRN
  Administered 2016-09-19 (×3): 1 mg via INTRAVENOUS
  Filled 2016-09-19 (×3): qty 1

## 2016-09-19 MED ORDER — FLEET ENEMA 7-19 GM/118ML RE ENEM: 1.0000 | ENEMA | RECTAL | Status: DC | PRN

## 2016-09-19 MED ORDER — PHENYLEPHRINE 40 MCG/ML (10ML) SYRINGE FOR IV PUSH (FOR BLOOD PRESSURE SUPPORT)
80.0000 ug | PREFILLED_SYRINGE | INTRAVENOUS | Status: DC | PRN
  Administered 2016-09-20: 80 ug via INTRAVENOUS

## 2016-09-19 MED ORDER — ACETAMINOPHEN 325 MG PO TABS
650.0000 mg | ORAL_TABLET | ORAL | Status: DC | PRN
  Administered 2016-09-20: 650 mg via ORAL
  Filled 2016-09-19: qty 2

## 2016-09-19 MED ORDER — MISOPROSTOL 25 MCG QUARTER TABLET
25.0000 ug | ORAL_TABLET | ORAL | Status: DC | PRN
  Administered 2016-09-19 (×3): 25 ug via VAGINAL
  Filled 2016-09-19 (×4): qty 0.25

## 2016-09-19 MED ORDER — OXYTOCIN 40 UNITS IN LACTATED RINGERS INFUSION - SIMPLE MED: 2.5000 [IU]/h | INTRAVENOUS | Status: DC

## 2016-09-19 MED ORDER — OXYTOCIN 40 UNITS IN LACTATED RINGERS INFUSION - SIMPLE MED
1.0000 m[IU]/min | INTRAVENOUS | Status: DC
  Administered 2016-09-19: 2 m[IU]/min via INTRAVENOUS

## 2016-09-19 NOTE — Progress Notes (Signed)
Patient ID: Christina Tucker, female   DOB: July 20, 1986, 30 y.o.   MRN: [id number]   Pt feeling contractions Vitals:   09/19/16 1720 09/19/16 1725 09/19/16 1730 09/19/16 1735  BP:      Pulse:      Resp: 18 20 18 20   Temp:      TempSrc:      Weight:      Height:       AOx3 cvx 3+/50/-2 toco Q 1-2  AROM clear  A/P  1) Epidural 2) Pitocin once epidural in place

## 2016-09-19 NOTE — Progress Notes (Signed)
Patient ID: Christina Tucker, female   DOB: 1986-06-07, 30 y.o.   MRN: [id number]  S: Comfortable with epidural  O:  Vitals:   09/19/16 2131 09/19/16 2201 09/19/16 2232 09/19/16 2301  BP: (!) 111/56 (!) 120/45 (!) 107/38 (!) 120/49  Pulse: 91 77 67 76  Resp: 18 18 18 18   Temp: 98.8 F (37.1 C)   98.7 F (37.1 C)  TempSrc: Oral   Oral  SpO2:      Weight:      Height:       AOx3, NAD Cvx 4/50/-2  toco irregular Q2-4   IUPC placed  A/P 1) Continue pit 2) FHR had shown some variable appearing decels but could not determine contraction pattern therefore IUPC placed. FWB reassuring with accels

## 2016-09-19 NOTE — Anesthesia Pain Management Evaluation Note (Signed)
  CRNA Pain Management Visit Note  Patient: Christina Tucker, 30 y.o., female  "Hello I am a member of the anesthesia team at [facility]. We have an anesthesia team available at all times to provide care throughout the hospital, including epidural management and anesthesia for C-section. I don't know your plan for the delivery whether it a natural birth, water birth, IV sedation, nitrous supplementation, doula or epidural, but we want to meet your pain goals."   1.Was your pain managed to your expectations on prior hospitalizations?   No   2.What is your expectation for pain management during this hospitalization?     Epidural and IV pain meds  3.How can we help you reach that goal? IV medication redosing at this time  Record the patient's initial score and the patient's pain goal.   Pain: 6  Pain Goal: 0-10 [facility] wants you to be able to say your pain was always managed very well.  [facility],[name] 09/19/2016

## 2016-09-19 NOTE — H&P (Signed)
Christina Tucker is a 30 y.o. female presenting for IOL for gestational hypertension  30yo G1P0 @ 37+1 presents for IOL for gestational hypertension. Additionally, she has been S>D, last US for EFW showed EFW 4200 grams. Pt was diagnosed with gestational hypertension at 35 weeks  Pt has a h/o herniated disk with surgery at 2617. Involving herniated L4-L5 disk. Op note is in EPIC. Pts orthopedist recommended early epidural for pain management OB History    Gravida Para Term Preterm AB Living   1             SAB TAB Ectopic Multiple Live Births                 Past Medical History:  Diagnosis Date  . Asthma    had to use one puff a few weeks ago  . Hypertension    gestational   Past Surgical History:  Procedure Laterality Date  . adenoids removed     at time of tonsillectomy  . ANKLE SURGERY    . APPENDECTOMY    . BACK SURGERY    . KNEE SURGERY    . TONSILLECTOMY    . WISDOM TOOTH EXTRACTION     Family History: family history is not on file. Social History:  reports that she has never smoked. She has never used smokeless tobacco. She reports that she does not drink alcohol or use drugs.     Maternal Diabetes: No Genetic Screening: Normal Maternal Ultrasounds/Referrals: Normal Fetal Ultrasounds or other Referrals:  None Maternal Substance Abuse:  No Significant Maternal Medications:  None Significant Maternal Lab Results:  None Other Comments:  None  ROS History Dilation: 3 Effacement (%): 50 Station: -2 Exam by:: Dr. Tenny Crawoss Blood pressure (!) 111/44, pulse 82, temperature 98 F (36.7 C), temperature source Oral, resp. rate 20, height 5\' 3"  (1.6 m), weight 108.9 kg (240 lb). Exam Physical Exam  Prenatal labs: ABO, Rh: --/--/O POS, O POS (10/02 0245) Antibody: NEG (10/02 0245) Rubella: Immune (04/04 0000) RPR: Non Reactive (10/02 0245)  HBsAg: Negative (04/04 0000)  HIV: Non-reactive (04/04 0000)  GBS: Negative (09/20 0000)   Assessment/Plan: 1) Admit 2)  Cytotec Q4 hr per vagina  3) Epidural on request    Thalya Fouche H. 09/19/2016, 5:34 PM

## 2016-09-19 NOTE — Anesthesia Preprocedure Evaluation (Signed)
Anesthesia Evaluation  Patient identified by MRN, date of birth, ID band Patient awake    Reviewed: Allergy & Precautions, H&P , NPO status , Patient's Chart, lab work & pertinent test results  History of Anesthesia Complications Negative for: history of anesthetic complications  Airway Mallampati: II  TM Distance: >3 FB Neck ROM: full    Dental no notable dental hx. (+) Teeth Intact   Pulmonary asthma ,    Pulmonary exam normal breath sounds clear to auscultation       Cardiovascular hypertension, Normal cardiovascular exam Rhythm:regular Rate:Normal     Neuro/Psych negative neurological ROS  negative psych ROS   GI/Hepatic negative GI ROS, Neg liver ROS,   Endo/Other  negative endocrine ROS  Renal/GU negative Renal ROS  negative genitourinary   Musculoskeletal   Abdominal   Peds  Hematology negative hematology ROS (+)   Anesthesia Other Findings   Reproductive/Obstetrics (+) Pregnancy                             Anesthesia Physical Anesthesia Plan  ASA: II  Anesthesia Plan: Epidural   Post-op Pain Management:    Induction:   Airway Management Planned:   Additional Equipment:   Intra-op Plan:   Post-operative Plan:   Informed Consent: I have reviewed the patients History and Physical, chart, labs and discussed the procedure including the risks, benefits and alternatives for the proposed anesthesia with the patient or authorized representative who has indicated his/her understanding and acceptance.     Plan Discussed with:   Anesthesia Plan Comments:         Anesthesia Quick Evaluation

## 2016-09-19 NOTE — Anesthesia Pain Management Evaluation Note (Signed)
  CRNA Pain Management Visit Note  Patient: Christina Tucker, 30 y.o., female  "Hello I am a [facility] of the anesthesia team [facility]. We have an anesthesia team available at all times to provide care throughout the hospital, including epidural management and anesthesia for C-section. I don't know your plan for the delivery whether it a natural birth, water birth, IV sedation, nitrous supplementation, doula or epidural, but we want to meet your pain goals."   1.Was your pain managed to your expectations on prior hospitalizations?   No. Patient states she has had multiple surgeries and hospitalizations with poor pain management.    2.What is your expectation for pain management during this hospitalization?     Epidural and IV pain meds  3.How can we help you reach that goal? Comfort measures, IV medications, epidural  Record the patient's initial score and the patient's pain goal.   Pain: 6  Pain Goal: 4 [facility] wants you to be able to say your pain was always managed very well.  [facility],Christina Tucker 09/19/2016

## 2016-09-19 NOTE — Anesthesia Procedure Notes (Signed)
Epidural Patient location during procedure: OB  Staffing Anesthesiologist: Phillips GroutARIGNAN, Chrissi Crow Performed: anesthesiologist   Preanesthetic Checklist Completed: patient identified, site marked, surgical consent, pre-op evaluation, timeout performed, IV checked, risks and benefits discussed and monitors and equipment checked  Epidural Patient position: sitting Prep: DuraPrep Patient monitoring: heart rate, continuous pulse ox and blood pressure Approach: right paramedian Location: L2-L3 Injection technique: LOR saline  Needle:  Needle type: Tuohy  Needle gauge: 17 G Needle length: 9 cm and 9 Needle insertion depth: 7 cm Catheter type: closed end flexible Catheter size: 20 Guage Catheter at skin depth: 12 cm Test dose: negative  Assessment Events: blood not aspirated, injection not painful, no injection resistance, negative IV test and no paresthesia  Additional Notes Patient identified. Risks/Benefits/Options discussed with patient including but not limited to bleeding, infection, nerve damage, paralysis, failed block, incomplete pain control, headache, blood pressure changes, nausea, vomiting, reactions to medication both or allergic, itching and postpartum back pain. Confirmed with bedside nurse the patient's most recent platelet count. Confirmed with patient that they are not currently taking any anticoagulation, have any bleeding history or any family history of bleeding disorders. Patient expressed understanding and wished to proceed. All questions were answered. Sterile technique was used throughout the entire procedure. Please see nursing notes for vital signs. Test dose was given through epidural needle and negative prior to continuing to dose epidural or start infusion. Warning signs of high block given to the patient including shortness of breath, tingling/numbness in hands, complete motor block, or any concerning symptoms with instructions to call for help. Patient was given  instructions on fall risk and not to get out of bed. All questions and concerns addressed with instructions to call with any issues.

## 2016-09-20 ENCOUNTER — Encounter (HOSPITAL_COMMUNITY): Payer: Self-pay | Admitting: Anesthesiology

## 2016-09-20 ENCOUNTER — Encounter (HOSPITAL_COMMUNITY)
Admission: AD | Disposition: A | Discharge status: Home / Self Care | Payer: Self-pay | Source: Ambulatory Visit | Attending: Obstetrics and Gynecology

## 2016-09-20 DIAGNOSIS — Z9889 Other specified postprocedural states: Secondary | ICD-10-CM

## 2016-09-20 SURGERY — Surgical Case
Anesthesia: Epidural

## 2016-09-20 MED ORDER — FERROUS SULFATE 325 (65 FE) MG PO TABS
325.0000 mg | ORAL_TABLET | Freq: Two times a day (BID) | ORAL | Status: DC
  Administered 2016-09-21 – 2016-09-22 (×3): 325 mg via ORAL
  Filled 2016-09-20 (×3): qty 1

## 2016-09-20 MED ORDER — LACTATED RINGERS IV SOLN
INTRAVENOUS | Status: DC | PRN
  Administered 2016-09-20: 10:00:00 via INTRAVENOUS

## 2016-09-20 MED ORDER — METHYLERGONOVINE MALEATE 0.2 MG PO TABS: 0.2000 mg | ORAL_TABLET | ORAL | Status: DC | PRN

## 2016-09-20 MED ORDER — SODIUM BICARBONATE 8.4 % IV SOLN
INTRAVENOUS | Status: AC
  Filled 2016-09-20: qty 50

## 2016-09-20 MED ORDER — LACTATED RINGERS IV SOLN
INTRAVENOUS | Status: DC
  Administered 2016-09-20 (×2): via INTRAVENOUS

## 2016-09-20 MED ORDER — ACETAMINOPHEN 325 MG PO TABS: 650.0000 mg | ORAL_TABLET | ORAL | Status: DC | PRN

## 2016-09-20 MED ORDER — BISACODYL 10 MG RE SUPP: 10.0000 mg | Freq: Every day | RECTAL | Status: DC | PRN

## 2016-09-20 MED ORDER — OXYTOCIN 10 UNIT/ML IJ SOLN
INTRAVENOUS | Status: DC | PRN
  Administered 2016-09-20: 40 [IU] via INTRAVENOUS

## 2016-09-20 MED ORDER — HYDROMORPHONE HCL 1 MG/ML IJ SOLN: 1.0000 mg | INTRAMUSCULAR | Status: DC | PRN

## 2016-09-20 MED ORDER — PRENATAL MULTIVITAMIN CH
1.0000 | ORAL_TABLET | Freq: Every day | ORAL | Status: DC
  Administered 2016-09-21 – 2016-09-22 (×2): 1 via ORAL
  Filled 2016-09-20 (×2): qty 1

## 2016-09-20 MED ORDER — BUPIVACAINE HCL (PF) 0.25 % IJ SOLN
INTRAMUSCULAR | Status: DC | PRN
  Administered 2016-09-20: 8 mL via EPIDURAL

## 2016-09-20 MED ORDER — ONDANSETRON HCL 4 MG/2ML IJ SOLN
INTRAMUSCULAR | Status: DC | PRN
  Administered 2016-09-20: 4 mg via INTRAVENOUS

## 2016-09-20 MED ORDER — OXYTOCIN 10 UNIT/ML IJ SOLN
INTRAMUSCULAR | Status: AC
  Filled 2016-09-20: qty 4

## 2016-09-20 MED ORDER — PHENYLEPHRINE HCL 10 MG/ML IJ SOLN
INTRAMUSCULAR | Status: DC | PRN
  Administered 2016-09-20: 40 ug via INTRAVENOUS
  Administered 2016-09-20: 120 ug via INTRAVENOUS
  Administered 2016-09-20 (×4): 80 ug via INTRAVENOUS
  Administered 2016-09-20: 40 ug via INTRAVENOUS
  Administered 2016-09-20: 80 ug via INTRAVENOUS
  Administered 2016-09-20: 120 ug via INTRAVENOUS
  Administered 2016-09-20: 80 ug via INTRAVENOUS

## 2016-09-20 MED ORDER — MEASLES, MUMPS & RUBELLA VAC ~~LOC~~ INJ
0.5000 mL | INJECTION | Freq: Once | SUBCUTANEOUS | Status: DC
  Filled 2016-09-20: qty 0.5

## 2016-09-20 MED ORDER — DIPHENHYDRAMINE HCL 25 MG PO CAPS: 25.0000 mg | ORAL_CAPSULE | Freq: Four times a day (QID) | ORAL | Status: DC | PRN

## 2016-09-20 MED ORDER — CEFAZOLIN SODIUM-DEXTROSE 2-3 GM-% IV SOLR
INTRAVENOUS | Status: DC | PRN
  Administered 2016-09-20: 2 g via INTRAVENOUS

## 2016-09-20 MED ORDER — SODIUM CHLORIDE 0.9 % IR SOLN
Status: DC | PRN
  Administered 2016-09-20: 1

## 2016-09-20 MED ORDER — WITCH HAZEL-GLYCERIN EX PADS: 1.0000 "application " | MEDICATED_PAD | CUTANEOUS | Status: DC | PRN

## 2016-09-20 MED ORDER — TETANUS-DIPHTH-ACELL PERTUSSIS 5-2.5-18.5 LF-MCG/0.5 IM SUSP: 0.5000 mL | Freq: Once | INTRAMUSCULAR | Status: DC

## 2016-09-20 MED ORDER — DIBUCAINE 1 % RE OINT: 1.0000 "application " | TOPICAL_OINTMENT | RECTAL | Status: DC | PRN

## 2016-09-20 MED ORDER — IBUPROFEN 600 MG PO TABS
600.0000 mg | ORAL_TABLET | Freq: Four times a day (QID) | ORAL | Status: DC
  Administered 2016-09-21 – 2016-09-22 (×5): 600 mg via ORAL
  Filled 2016-09-20 (×7): qty 1

## 2016-09-20 MED ORDER — METHYLERGONOVINE MALEATE 0.2 MG/ML IJ SOLN: 0.2000 mg | INTRAMUSCULAR | Status: DC | PRN

## 2016-09-20 MED ORDER — PHENYLEPHRINE 40 MCG/ML (10ML) SYRINGE FOR IV PUSH (FOR BLOOD PRESSURE SUPPORT)
PREFILLED_SYRINGE | INTRAVENOUS | Status: AC
  Filled 2016-09-20: qty 10

## 2016-09-20 MED ORDER — LACTATED RINGERS IV SOLN
INTRAVENOUS | Status: DC | PRN
  Administered 2016-09-20 (×4): via INTRAVENOUS

## 2016-09-20 MED ORDER — SIMETHICONE 80 MG PO CHEW
80.0000 mg | CHEWABLE_TABLET | ORAL | Status: DC
  Administered 2016-09-21 – 2016-09-22 (×2): 80 mg via ORAL
  Filled 2016-09-20 (×2): qty 1

## 2016-09-20 MED ORDER — LIDOCAINE-EPINEPHRINE (PF) 2 %-1:200000 IJ SOLN
INTRAMUSCULAR | Status: AC
  Filled 2016-09-20: qty 20

## 2016-09-20 MED ORDER — FLEET ENEMA 7-19 GM/118ML RE ENEM: 1.0000 | ENEMA | Freq: Every day | RECTAL | Status: DC | PRN

## 2016-09-20 MED ORDER — METOCLOPRAMIDE HCL 5 MG/ML IJ SOLN
INTRAMUSCULAR | Status: AC
  Filled 2016-09-20: qty 2

## 2016-09-20 MED ORDER — ACETAMINOPHEN 500 MG PO TABS
1000.0000 mg | ORAL_TABLET | Freq: Four times a day (QID) | ORAL | Status: DC | PRN
  Administered 2016-09-20 – 2016-09-22 (×4): 1000 mg via ORAL
  Filled 2016-09-20 (×5): qty 2

## 2016-09-20 MED ORDER — KETOROLAC TROMETHAMINE 30 MG/ML IJ SOLN
30.0000 mg | Freq: Four times a day (QID) | INTRAMUSCULAR | Status: AC | PRN
  Administered 2016-09-20 – 2016-09-21 (×3): 30 mg via INTRAVENOUS
  Filled 2016-09-20 (×3): qty 1

## 2016-09-20 MED ORDER — SIMETHICONE 80 MG PO CHEW
80.0000 mg | CHEWABLE_TABLET | Freq: Three times a day (TID) | ORAL | Status: DC
  Administered 2016-09-21 – 2016-09-22 (×5): 80 mg via ORAL
  Filled 2016-09-20 (×5): qty 1

## 2016-09-20 MED ORDER — FENTANYL CITRATE (PF) 100 MCG/2ML IJ SOLN
25.0000 ug | INTRAMUSCULAR | Status: DC | PRN
  Administered 2016-09-20: 25 ug via INTRAVENOUS

## 2016-09-20 MED ORDER — COCONUT OIL OIL: 1.0000 "application " | TOPICAL_OIL | Status: DC | PRN

## 2016-09-20 MED ORDER — METOCLOPRAMIDE HCL 5 MG/ML IJ SOLN
INTRAMUSCULAR | Status: DC | PRN
  Administered 2016-09-20 (×2): 5 mg via INTRAVENOUS

## 2016-09-20 MED ORDER — SIMETHICONE 80 MG PO CHEW
80.0000 mg | CHEWABLE_TABLET | ORAL | Status: DC | PRN
  Administered 2016-09-20: 80 mg via ORAL
  Filled 2016-09-20: qty 1

## 2016-09-20 MED ORDER — FENTANYL CITRATE (PF) 100 MCG/2ML IJ SOLN
INTRAMUSCULAR | Status: AC
  Filled 2016-09-20: qty 2

## 2016-09-20 MED ORDER — PHENYLEPHRINE 40 MCG/ML (10ML) SYRINGE FOR IV PUSH (FOR BLOOD PRESSURE SUPPORT)
PREFILLED_SYRINGE | INTRAVENOUS | Status: AC
  Filled 2016-09-20: qty 60

## 2016-09-20 MED ORDER — SENNOSIDES-DOCUSATE SODIUM 8.6-50 MG PO TABS
2.0000 | ORAL_TABLET | ORAL | Status: DC
  Administered 2016-09-21 – 2016-09-22 (×2): 2 via ORAL
  Filled 2016-09-20 (×2): qty 2

## 2016-09-20 MED ORDER — MENTHOL 3 MG MT LOZG: 1.0000 | LOZENGE | OROMUCOSAL | Status: DC | PRN

## 2016-09-20 MED ORDER — FENTANYL CITRATE (PF) 100 MCG/2ML IJ SOLN
INTRAMUSCULAR | Status: DC | PRN
  Administered 2016-09-20 (×2): 50 ug via INTRAVENOUS

## 2016-09-20 MED ORDER — ZOLPIDEM TARTRATE 5 MG PO TABS: 5.0000 mg | ORAL_TABLET | Freq: Every evening | ORAL | Status: DC | PRN

## 2016-09-20 MED ORDER — LACTATED RINGERS IV SOLN
INTRAVENOUS | Status: DC
  Administered 2016-09-20: 300 mL via INTRAUTERINE

## 2016-09-20 MED ORDER — SODIUM BICARBONATE 8.4 % IV SOLN
INTRAVENOUS | Status: DC | PRN
  Administered 2016-09-20: 7 mL via EPIDURAL
  Administered 2016-09-20 (×2): 5 mL via EPIDURAL

## 2016-09-20 MED ORDER — PHENYLEPHRINE 8 MG IN D5W 100 ML (0.08MG/ML) PREMIX OPTIME
INJECTION | INTRAVENOUS | Status: AC
  Filled 2016-09-20: qty 100

## 2016-09-20 MED ORDER — SCOPOLAMINE 1 MG/3DAYS TD PT72
MEDICATED_PATCH | TRANSDERMAL | Status: DC | PRN
  Administered 2016-09-20: 1 via TRANSDERMAL

## 2016-09-20 MED ORDER — ONDANSETRON HCL 4 MG/2ML IJ SOLN
INTRAMUSCULAR | Status: AC
  Filled 2016-09-20: qty 2

## 2016-09-20 MED ORDER — OXYTOCIN 40 UNITS IN LACTATED RINGERS INFUSION - SIMPLE MED: 2.5000 [IU]/h | INTRAVENOUS | Status: AC

## 2016-09-20 MED ORDER — FENTANYL CITRATE (PF) 100 MCG/2ML IJ SOLN
INTRAMUSCULAR | Status: AC
Start: 2016-09-20 — End: 2016-09-21
  Filled 2016-09-20: qty 2

## 2016-09-20 SURGICAL SUPPLY — 36 items
APL SKNCLS STERI-STRIP NONHPOA (GAUZE/BANDAGES/DRESSINGS) ×1
BENZOIN TINCTURE PRP APPL 2/3 (GAUZE/BANDAGES/DRESSINGS) ×3 IMPLANT
CHLORAPREP W/TINT 26ML (MISCELLANEOUS) ×3 IMPLANT
CLAMP CORD UMBIL (MISCELLANEOUS) IMPLANT
CLOSURE WOUND 1/2 X4 (GAUZE/BANDAGES/DRESSINGS) ×1
CLOTH BEACON ORANGE TIMEOUT ST (SAFETY) ×3 IMPLANT
DRSG OPSITE POSTOP 4X10 (GAUZE/BANDAGES/DRESSINGS) ×3 IMPLANT
ELECT REM PT RETURN 9FT ADLT (ELECTROSURGICAL) ×3
ELECTRODE REM PT RTRN 9FT ADLT (ELECTROSURGICAL) ×1 IMPLANT
EXTRACTOR VACUUM BELL STYLE (SUCTIONS) IMPLANT
GAUZE SPONGE 4X4 12PLY STRL (GAUZE/BANDAGES/DRESSINGS) ×1 IMPLANT
GLOVE BIO SURGEON STRL SZ7 (GLOVE) ×3 IMPLANT
GLOVE BIOGEL PI IND STRL 7.0 (GLOVE) ×1 IMPLANT
GLOVE BIOGEL PI INDICATOR 7.0 (GLOVE) ×2
GOWN STRL REUS W/TWL LRG LVL3 (GOWN DISPOSABLE) ×6 IMPLANT
KIT ABG SYR 3ML LUER SLIP (SYRINGE) IMPLANT
NDL HYPO 25X5/8 SAFETYGLIDE (NEEDLE) IMPLANT
NEEDLE HYPO 25X5/8 SAFETYGLIDE (NEEDLE) IMPLANT
NS IRRIG 1000ML POUR BTL (IV SOLUTION) ×3 IMPLANT
PACK C SECTION WH (CUSTOM PROCEDURE TRAY) ×3 IMPLANT
PAD ABD 7.5X8 STRL (GAUZE/BANDAGES/DRESSINGS) ×2 IMPLANT
PAD OB MATERNITY 4.3X12.25 (PERSONAL CARE ITEMS) ×3 IMPLANT
PENCIL SMOKE EVAC W/HOLSTER (ELECTROSURGICAL) ×3 IMPLANT
RTRCTR C-SECT PINK 25CM LRG (MISCELLANEOUS) ×3 IMPLANT
SPONGE GAUZE 4X4 12PLY (GAUZE/BANDAGES/DRESSINGS) ×2 IMPLANT
STRIP CLOSURE SKIN 1/2X4 (GAUZE/BANDAGES/DRESSINGS) ×2 IMPLANT
SUT MNCRL 0 VIOLET CTX 36 (SUTURE) ×2 IMPLANT
SUT MONOCRYL 0 CTX 36 (SUTURE) ×6
SUT PLAIN 2 0 XLH (SUTURE) IMPLANT
SUT VIC AB 0 CT1 27 (SUTURE) ×6
SUT VIC AB 0 CT1 27XBRD ANBCTR (SUTURE) ×2 IMPLANT
SUT VIC AB 2-0 CT1 27 (SUTURE) ×3
SUT VIC AB 2-0 CT1 TAPERPNT 27 (SUTURE) ×1 IMPLANT
SUT VIC AB 4-0 KS 27 (SUTURE) ×5 IMPLANT
TOWEL OR 17X24 6PK STRL BLUE (TOWEL DISPOSABLE) ×3 IMPLANT
TRAY FOLEY CATH SILVER 14FR (SET/KITS/TRAYS/PACK) ×3 IMPLANT

## 2016-09-20 NOTE — Brief Op Note (Signed)
09/19/2016 - 09/20/2016  10:56 AM  PATIENT:  Christina Tucker  30 y.o. female  PRE-OPERATIVE DIAGNOSIS:  fetal intolerance of labor  POST-OPERATIVE DIAGNOSIS:  fetal intolerance of labor  PROCEDURE:  Procedure(s): CESAREAN SECTION (N/A)  SURGEON:  Surgeon(s) and Role:    * Derius Ghosh, MD - Primary  ASSISTANTS: RNFP   ANESTHESIA:   epidural  EBL:  Total I/O In: 2000 [I.V.:2000] Out: 1650 [Urine:1050; Blood:600]  SPECIMEN:  No Specimen  DISPOSITION OF SPECIMEN:  PATHOLOGY  COUNTS:  YES  TOURNIQUET:  * No tourniquets in log *  DICTATION: .Note written in EPIC  PLAN OF CARE: Admit to inpatient   PATIENT DISPOSITION:  PACU - hemodynamically stable.   Delay start of Pharmacological VTE agent (>24hrs) due to surgical blood loss or risk of bleeding: not applicable  Complications:  none Medications:  Ancef, Pitocin Findings:  Baby female, Apgars 8,8, weight P.   Normal tubes, ovaries and uterus seen.  Baby was skin to skin with mother after birth in the OR.  Technique:  After adequate Epidural anesthesia was achieved, the patient was prepped and draped in usual sterile fashion.  A foley catheter was used to drain the bladder.  A pfannanstiel incision was made with the scalpel and carried down to the fascia with the bovie cautery. The fascia was incised in the midline with the scalpel and carried in a transverse curvilinear manner bilaterally.  The fascia was reflected superiorly and inferiorly off the rectus muscles and the muscles split in the midline.  A bowel free portion of the peritoneum was entered bluntly and then extended in a superior and inferior manner with good visualization of the bowel and bladder.  The Alexis instrument was then placed and the vesico-uterine fascia tented up and incised in a transverse curvilinear manner.  A 2 cm transverse incision was made in the upper portion of the lower uterine segment until the amnion was exposed.   The incision was  extended transversely in a blunt manner.  Clear fluid was noted and the baby delivered in the vertex presentation without complication.  The baby was bulb suctioned and the cord was clamped and cut.  The baby was then handed to awaiting Neonatology.  The placenta was then delivered manually and the uterus cleared of all debris.  The uterine incision was then closed with a running lock stitch of 0 monocryl.  An imbricating layer of 0 monocryl was closed as well. Excellent hemostasis of the uterine incision was achieved and the abdomen was cleared with irrigation.  The peritoneum was closed with a running stitch of 2-0 vicryl.  This incorporated the rectus muscles as a separate layer.  The fascia was then closed with a running stitch of 0 vicryl.  The subcutaneous layer was closed with interrupted  stitches of 2-0 plain gut.  The skin was closed with 4-0 vicryl on a Keith needle and steri-strips.  The patient tolerated the procedure well and was returned to the recovery room in stable condition.  All counts were correct times three.  Marinna Blane A   

## 2016-09-20 NOTE — Anesthesia Postprocedure Evaluation (Signed)
Anesthesia Post Note  Patient: Lawson RadarElizabeth Bueso  Procedure(s) Performed: Procedure(s) (LRB): CESAREAN SECTION (N/A)  Patient location during evaluation: Mother Baby Anesthesia Type: Epidural Level of consciousness: awake Pain management: satisfactory to patient Vital Signs Assessment: post-procedure vital signs reviewed and stable Respiratory status: spontaneous breathing Cardiovascular status: stable Anesthetic complications: no     Last Vitals:  Vitals:   09/20/16 1445 09/20/16 1545  BP: 125/60 (!) 95/59  Pulse: 77 67  Resp: 18 16  Temp: 36.9 C 36.6 C    Last Pain:  Vitals:   09/20/16 1545  TempSrc: Oral  PainSc:    Pain Goal: Patients Stated Pain Goal: 3 (09/20/16 1330)               Cephus ShellingBURGER,Brodie Scovell

## 2016-09-20 NOTE — Lactation Note (Signed)
This note was copied from a baby's chart. Lactation Consultation Note Initial visit at 7 hours of age.  Mom reports a feeding attempt after delivery and baby has been sleepy.  Mom reports + breast changes during pregnancy.  LC instructed mom on hand expression with a few small drops noted.  Mom has more compressible breast tissue on left breast.  LC oassist with STS and latch in football hold on right breast.  Mom is recovering from a c/s with mgm at bedside.  Baby is not eager to eat, baby mouthed breast, but did not latch and pushed back when attempting to latch.  Baby remains STS with mom.   LC encouraged mom to continue hand expression and offer by spoon feeding if baby awakens and not latching well.   [facility] resources given and discussed.  Encouraged to feed with early cues on demand.  Early newborn behavior discussed.  Mom to call for assist as needed.      Patient Name: Christina Tucker WUJWJ'XToday's Date: 09/20/2016 Reason for consult: Initial assessment   Maternal Data Has patient been taught Hand Expression?: Yes  Feeding Feeding Type: Breast Fed  LATCH Score/Interventions Latch: Too sleepy or reluctant, no latch achieved, no sucking elicited. Intervention(s): Skin to skin;Teach feeding cues;Waking techniques Intervention(s): Breast compression  Audible Swallowing: None Intervention(s): Hand expression;Skin to skin  Type of Nipple: Everted at rest and after stimulation  Comfort (Breast/Nipple): Soft / non-tender     Hold (Positioning): Assistance needed to correctly position infant at breast and maintain latch. Intervention(s): Breastfeeding basics reviewed;Support Pillows;Position options;Skin to skin  LATCH Score: 5  Lactation Tools Discussed/Used WIC Program: No   Consult Status Consult Status: Follow-up Date: 09/21/16 Follow-up type: In-patient    Sharri Loya, Arvella MerlesJana Lynn 09/20/2016, 5:22 PM

## 2016-09-20 NOTE — Op Note (Signed)
09/19/2016 - 09/20/2016  10:56 AM  PATIENT:  Christina Tucker  30 y.o. female  PRE-OPERATIVE DIAGNOSIS:  fetal intolerance of labor  POST-OPERATIVE DIAGNOSIS:  fetal intolerance of labor  PROCEDURE:  Procedure(s): CESAREAN SECTION (N/A)  SURGEON:  Surgeon(s) and Role:    * Carrington ClampMichelle Manolo Bosket, MD - Primary  ASSISTANTS: RNFP   ANESTHESIA:   epidural  EBL:  Total I/O In: 2000 [I.V.:2000] Out: 1650 [Urine:1050; Blood:600]  SPECIMEN:  No Specimen  DISPOSITION OF SPECIMEN:  PATHOLOGY  COUNTS:  YES  TOURNIQUET:  * No tourniquets in log *  DICTATION: .Note written in EPIC  PLAN OF CARE: Admit to inpatient   PATIENT DISPOSITION:  PACU - hemodynamically stable.   Delay start of Pharmacological VTE agent (>24hrs) due to surgical blood loss or risk of bleeding: not applicable  Complications:  none Medications:  Ancef, Pitocin Findings:  Baby female, Apgars 8,8, weight P.   Normal tubes, ovaries and uterus seen.  Baby was skin to skin with mother after birth in the OR.  Technique:  After adequate Epidural anesthesia was achieved, the patient was prepped and draped in usual sterile fashion.  A foley catheter was used to drain the bladder.  A pfannanstiel incision was made with the scalpel and carried down to the fascia with the bovie cautery. The fascia was incised in the midline with the scalpel and carried in a transverse curvilinear manner bilaterally.  The fascia was reflected superiorly and inferiorly off the rectus muscles and the muscles split in the midline.  A bowel free portion of the peritoneum was entered bluntly and then extended in a superior and inferior manner with good visualization of the bowel and bladder.  The Alexis instrument was then placed and the vesico-uterine fascia tented up and incised in a transverse curvilinear manner.  A 2 cm transverse incision was made in the upper portion of the lower uterine segment until the amnion was exposed.   The incision was  extended transversely in a blunt manner.  Clear fluid was noted and the baby delivered in the vertex presentation without complication.  The baby was bulb suctioned and the cord was clamped and cut.  The baby was then handed to awaiting Neonatology.  The placenta was then delivered manually and the uterus cleared of all debris.  The uterine incision was then closed with a running lock stitch of 0 monocryl.  An imbricating layer of 0 monocryl was closed as well. Excellent hemostasis of the uterine incision was achieved and the abdomen was cleared with irrigation.  The peritoneum was closed with a running stitch of 2-0 vicryl.  This incorporated the rectus muscles as a separate layer.  The fascia was then closed with a running stitch of 0 vicryl.  The subcutaneous layer was closed with interrupted  stitches of 2-0 plain gut.  The skin was closed with 4-0 vicryl on a Keith needle and steri-strips.  The patient tolerated the procedure well and was returned to the recovery room in stable condition.  All counts were correct times three.  Tymeshia Awan A

## 2016-09-20 NOTE — Transfer of Care (Signed)
Immediate Anesthesia Transfer of Care Note  Patient: Christina Tucker  Procedure(s) Performed: Procedure(s): CESAREAN SECTION (N/A)  Patient Location: PACU  Anesthesia Type:Epidural  Level of Consciousness: awake, alert  and oriented  Airway & Oxygen Therapy: Patient Spontanous Breathing  Post-op Assessment: Report given to RN and Post -op Vital signs reviewed and stable  Post vital signs: Reviewed and stable  Last Vitals:  Vitals:   09/20/16 0932 09/20/16 0948  BP: 134/68   Pulse: (!) 114   Resp: 16   Temp:  36.9 C    Last Pain:  Vitals:   09/20/16 0948  TempSrc: Oral  PainSc:          Complications: No apparent anesthesia complications

## 2016-09-20 NOTE — Progress Notes (Signed)
FHTs now 150s with repetitive moderate to severe variables despite recusitative measures such as amnioinfusion.   SVE 7/80/-2  For primary C/S nonreasurring fhts cat 2 tracing remote from vagina delivery.  All risks benefits and alternatives d/w pt and she desires to proceed.

## 2016-09-21 ENCOUNTER — Encounter (HOSPITAL_COMMUNITY): Payer: Self-pay | Admitting: Obstetrics and Gynecology

## 2016-09-21 LAB — CBC
HEMATOCRIT: 29.1 % — AB (ref 36.0–46.0)
Hemoglobin: 10 g/dL — ABNORMAL LOW (ref 12.0–15.0)
MCH: 30.7 pg (ref 26.0–34.0)
MCHC: 34.4 g/dL (ref 30.0–36.0)
MCV: 89.3 fL (ref 78.0–100.0)
Platelets: 230 10*3/uL (ref 150–400)
RBC: 3.26 MIL/uL — ABNORMAL LOW (ref 3.87–5.11)
RDW: 13.9 % (ref 11.5–15.5)
WBC: 13.1 10*3/uL — ABNORMAL HIGH (ref 4.0–10.5)

## 2016-09-21 NOTE — Lactation Note (Signed)
This note was copied from a baby's chart. Lactation Consultation Note  Patient Name: Girl Lawson Radarlizabeth Muston ZOXWR'UToday's Date: 09/21/2016 Reason for consult: Follow-up assessment Baby at 33 hr of life. Mom requesting a demonstration on spoon feeding. The baby will scream out in her sleep but when mom trys to latch her she will not wake to feed. Baby was laying peacefully in mom's arms upon entry. Reviewed spoon feeding, gave 4 clean spoons. Discussed baby behavior, feeding frequency, baby belly size, voids, wt loss, breast changes, and nipple care. Mom is aware of lactation services and support group. She will call as needed.     Maternal Data    Feeding Feeding Type: Breast Fed Length of feed: 0 min  LATCH Score/Interventions Latch: Repeated attempts needed to sustain latch, nipple held in mouth throughout feeding, stimulation needed to elicit sucking reflex. Intervention(s): Skin to skin Intervention(s): Assist with latch;Adjust position  Audible Swallowing: A few with stimulation Intervention(s): Skin to skin Intervention(s): Skin to skin  Type of Nipple: Everted at rest and after stimulation  Comfort (Breast/Nipple): Soft / non-tender     Hold (Positioning): Assistance needed to correctly position infant at breast and maintain latch.  LATCH Score: 7  Lactation Tools Discussed/Used     Consult Status Consult Status: Follow-up Date: 09/22/16 Follow-up type: In-patient    Rulon Eisenmengerlizabeth E Dereon Corkery 09/21/2016, 8:06 PM

## 2016-09-21 NOTE — Lactation Note (Signed)
This note was copied from a baby's chart. Lactation Consultation Note: Mother states that infant is feeding for a few mins on and off. Mother attempted last one hour ago.  Assist mother with postioning infant in football hold. Infant latched on and off for a few mins.  Observed frequent suckles and audible swallows. Mother informed that infant will cluster feed in next 24 hours. Mother advised to rouse infant if not cueing. Discussed cue base feeding . If infant doesn't feed. Mother to hand express and spoon feed. Mother has large amt of colostrum.   Patient Name: Christina Tucker Reason for consult: Follow-up assessment   Maternal Data    Feeding Feeding Type: Breast Fed Length of feed: 15 min  LATCH Score/Interventions Latch: Repeated attempts needed to sustain latch, nipple held in mouth throughout feeding, stimulation needed to elicit sucking reflex. Intervention(s): Skin to skin;Teach feeding cues;Waking techniques Intervention(s): Adjust position;Assist with latch;Breast compression  Audible Swallowing: A few with stimulation Intervention(s): Skin to skin Intervention(s): Hand expression  Type of Nipple: Everted at rest and after stimulation  Comfort (Breast/Nipple): Soft / non-tender     Hold (Positioning): Assistance needed to correctly position infant at breast and maintain latch. Intervention(s): Breastfeeding basics reviewed;Position options  LATCH Score: 7  Lactation Tools Discussed/Used     Consult Status      Michel BickersKendrick, Ridhaan Dreibelbis McCoy Tucker, 2:56 PM

## 2016-09-21 NOTE — Lactation Note (Signed)
This note was copied from a baby's chart. Lactation Consultation Note  Patient Name: Christina Lawson Radarlizabeth Cumpian UJWJX'BToday's Date: 09/21/2016 Reason for consult: Follow-up assessment Baby at 36 hr of life. Mom was requesting help with latch. Mom wants to use the cross cradle hold. Mom can position baby well at the breast on the R side but has difficulty with the L side. Suggest football position but mom declined. Mom denies breast or nipple pain. She can easily manually express colostrum bilaterally. She is aware of lactation services and support group. She will call as needed.   Maternal Data    Feeding Feeding Type: Breast Fed Length of feed: 20 min  LATCH Score/Interventions Latch: Repeated attempts needed to sustain latch, nipple held in mouth throughout feeding, stimulation needed to elicit sucking reflex. Intervention(s): Skin to skin Intervention(s): Adjust position;Assist with latch;Breast massage;Breast compression  Audible Swallowing: A few with stimulation Intervention(s): Hand expression;Skin to skin Intervention(s): Alternate breast massage  Type of Nipple: Everted at rest and after stimulation  Comfort (Breast/Nipple): Soft / non-tender     Hold (Positioning): Full assist, staff holds infant at breast Intervention(s): Support Pillows;Position options  LATCH Score: 6  Lactation Tools Discussed/Used     Consult Status Consult Status: Follow-up Date: 09/22/16 Follow-up type: In-patient    Christina Tucker 09/21/2016, 11:12 PM

## 2016-09-21 NOTE — Progress Notes (Signed)
POD#1 Pt without complaints.Lochia-wnl VSSAF Labs ok IMP/ Stable Plan/Routine care.

## 2016-09-22 MED ORDER — OXYCODONE-ACETAMINOPHEN 5-325 MG PO TABS: 1.0000 | ORAL_TABLET | Freq: Four times a day (QID) | ORAL | 0 refills | Status: DC | PRN

## 2016-09-22 NOTE — Lactation Note (Signed)
This note was copied from a baby's chart. Lactation Consultation Note  Patient Name: Christina Tucker UJWJX'BToday's Date: 09/22/2016 Reason for consult: Follow-up assessment      P1 with baby STS when LC entered the room.  Desires early DC.  Ped called LC to observe feed and provide hand pump to mom.  Baby showing cues.  Mom expressed having difficulty and wasn't comfortable with cross cradle so football position used.  [facility] breast compression in order for baby to sustain and latch better.  Mom was also taught breast massage to illicit more swallowing and keep baby awake at breast.  Baby would detach after a few sucks and noted swallows in football position so LC suggested laid back nursing.  LC encouraged dad to help set up pillows and hand baby to mom after she got settled.  Mom laid back but was unable to get on side due to c sec discomfort. Mom laid back flat on back and baby was aligned well and placed at moms side.  Dad assisted.  Baby latched easily and audible swallows heard; good suck swallow pattern noted.  Mom used free hand to massage breast. Baby fed continuously for 15 minutes.  Mom expressed feeling comfortable and asked dad to please take a pic so she could remember this position.  Baby detached and was satisfied and placed STS with mom.  Out patient appointment made for next Tuesday.  Mom informed about BF support groups, BF resource websites,and Lactation phone number to call for concerns or questions prior to op appt.  LC mom encouraged to keep a diary of voids/stools/feeds for ped. Appointment.  Reviewed BM storage guidelines and importance of feeding on demand and waking techniques as well as expected voids and stools.  LC reviewed engorgement prevention and what to do if she becomes engorged as well as expectations of when milk comes in.      Maternal Data    Feeding Feeding Type: Breast Fed Length of feed: 1 min  LATCH Score/Interventions Latch: Grasps breast easily,  tongue down, lips flanged, rhythmical sucking. (after changing to side lying/difficulty with cross and football) Intervention(s): Skin to skin;Teach feeding cues;Waking techniques Intervention(s): Adjust position;Assist with latch;Breast compression;Breast massage  Audible Swallowing: A few with stimulation Intervention(s): Skin to skin;Hand expression Intervention(s): Skin to skin;Hand expression  Type of Nipple: Everted at rest and after stimulation  Comfort (Breast/Nipple): Soft / non-tender     Hold (Positioning): Assistance needed to correctly position infant at breast and maintain latch.  LATCH Score: 8  Lactation Tools Discussed/Used Tools: Pump Breast pump type: Manual Pump Review: Milk Storage   Consult Status Consult Status: Complete Date: 09/22/16 Follow-up type: In-patient    Maryruth HancockKelly Suzanne [facility] 09/22/2016, 12:07 PM

## 2016-09-22 NOTE — Discharge Summary (Signed)
Obstetric Discharge Summary Reason for Admission: induction of labor and GHTN Prenatal Procedures: ultrasound Intrapartum Procedures: cesarean: low cervical, transverse Postpartum Procedures: none Complications-Operative and Postpartum: none Hemoglobin  Date Value Ref Range Status  09/21/2016 10.0 (L) 12.0 - 15.0 g/dL Final    Comment:    DELTA CHECK NOTED REPEATED TO VERIFY    HCT  Date Value Ref Range Status  09/21/2016 29.1 (L) 36.0 - 46.0 % Final    Physical Exam:  General: alert and cooperative Lochia: appropriate Uterine Fundus: firm Incision: healing well, no significant drainage DVT Evaluation: No evidence of DVT seen on physical exam.  Discharge Diagnoses: Term Pregnancy-delivered  Discharge Information: Date: 09/22/2016 Activity: pelvic rest Diet: routine Medications: PNV, Ibuprofen and Percocet ( pt with itching with several narcotics, RN has Rx for percocet and can void if pt with severe itching or other symptoms) Condition: stable Instructions: refer to practice specific booklet Discharge to: home Follow-up Information    HORVATH,MICHELLE A, MD Follow up in 4 week(s).   Specialty:  Obstetrics and Gynecology Contact information: [address] [place] [address] [phone]           Newborn Data: Live born female  Birth Weight: 7 lb 14.3 oz (3580 g) APGAR: 8, 8  Home with mother.  Philip AspenCALLAHAN, Abbegayle Denault 09/22/2016, 9:54 AM

## 2016-09-27 ENCOUNTER — Ambulatory Visit (HOSPITAL_COMMUNITY)
Admit: 2016-09-27 | Discharge: 2016-09-27 | Disposition: A | Discharge status: Home / Self Care | Payer: 59 | Attending: Obstetrics & Gynecology | Admitting: Obstetrics & Gynecology

## 2016-09-27 NOTE — Lactation Note (Addendum)
Lactation Consult  Mother's reason for visit:  Mother was scheduled for follow up out patient visit when discharged from the hospital. Visit Type:  Feeding assessment Appointment Notes:  Infant weight 7-1 at Peds,then office again in 2 day weight was  7-3 , yestesday she was 7-6 today, today 7-7.7 at [facility] office today infant is showing consistent weight gain. Consult:  Initial Lactation Consultant:  Michel Bickers  ________________________________________________________________________    ________________________________________________________________________  Mother's Name: Christina Tucker Type of delivery:  C/S  Breastfeeding Experience: none Maternal Medical Conditions:  Pregnancy induced hypertension and Asthma Maternal Medications:  Prenatal vit  ________________________________________________________________________  Breastfeeding History (Post Discharge)  Frequency of breastfeeding: once in last 3 days Duration of feeding: 20 mins     Pumping  Type of pump: mother has a harmony hand pump Frequency:  3 times daily Volume: 2 ounces   Infant Intake and Output Assessment  Voids: - 5-6 in 24 hrs.  Color:  Clear yellow Stools: 2  in 24 hrs.  Color:  Yellow  ________________________________________________________________________  Maternal Breast Assessment  Breast:  Filling Nipple:  Erect Pain level:  0 Pain interventions:  Bra  _______________________________________________________________________ Feeding Assessment/Evaluation  Mother was assisted with application of the nipple shield. Infant latched well with a #20 nipple shield. Infant tugged on and off the nipple shield for a few mins. Was finially able to get infant suckling with a good burst of suckles although the latch was still shallow. Mother very excited that the infant latched on  Mother has been pumping with a hand pump 3 times a day, Mother pumps 2 ounces  Infant's oral assessment:   WNL  Positioning:  Football Right breast  LATCH documentation:  Latch:  2 = Grasps breast easily, tongue down, lips flanged, rhythmical sucking.  Audible swallowing:  2 = Spontaneous and intermittent  Type of nipple:  2 = Everted at rest and after stimulation  Comfort (Breast/Nipple):  1 = Filling, red/small blisters or bruises, mild/mod discomfort  Hold (Positioning):  1 = Assistance needed to correctly position infant at breast and maintain latch  LATCH score:  8  Attached assessment:  Shallow  Lips flanged:  Yes.    Lips untucked:  Yes.    Suck assessment:  Displays both  Tools:  Nipple shield 20 mm Instructed on use and cleaning of tool:  Yes.    Pre-feed weight:  3394  Post-feed weight: 3400 Amount transferred   6 ml    Infant's oral assessment:  WNL  Positioning:  Football Left breast  LATCH documentation:  Latch:  2 = Grasps breast easily, tongue down, lips flanged, rhythmical sucking.  Audible swallowing:  2 = Spontaneous and intermittent  Type of nipple:  2 = Everted at rest and after stimulation  Comfort (Breast/Nipple):  1 = Filling, red/small blisters or bruises, mild/mod discomfort  Hold (Positioning):  1 = Assistance needed to correctly position infant at breast and maintain latch  LATCH score:  8  Attached assessment:  Shallow  Lips flanged:  Yes.    Lips untucked:  Yes.    Suck assessment:  Displays both  Tools:  Nipple shield 20 mm Instructed on use and cleaning of tool:  Yes.    Pre-feed weight:  3400 g   Post-feed weight:  3410g  Amount transferred:  10 ml Amount supplemented: 60 ml. Mother to feed infant in car and give 60 ml formula with a bottle Mother to pump when she gets home.   Total amount  transferred: 16 ml Total supplement given:  60 ml  Mother to breastfeeding every  2-3 hours  Mother to use the #20 or #24 nipple shield when  Mother advised to supplement infant with 60 ml of EBM/Formula after breastfeeding. Mother rented a  S[employer] pump and was advised to pump every 2-3 hours  mother scheduled a follow LC visit. On October 17 at 2;30. Mother is aware of support group and number to call lactation for concerns

## 2016-10-04 ENCOUNTER — Ambulatory Visit (HOSPITAL_COMMUNITY): Payer: 59

## 2017-03-14 ENCOUNTER — Encounter (HOSPITAL_BASED_OUTPATIENT_CLINIC_OR_DEPARTMENT_OTHER): Payer: Self-pay | Admitting: *Deleted

## 2017-03-14 NOTE — Progress Notes (Addendum)
NPO AFTER MN W/ EXCEPTION CLEAR LIQUIDS UNTIL 0730 (NO CREAM/ MILK PRODUCTS).  ARRIVE AT 1145.  NEEDS HG AND URINE PREG.   ADDENDUM:  CHANGE CASE TIME AND DAY TO Wednesday 03-29-2017 AT 1230.  CALLED AND SPOKE TO PT VIA PHONE.  NPO AFTER MN W/ EXCEPTION CLEAR LIQUIDS UNTIL 0630.  ARRIVE AT 1100.  SAME NEEDS AS ABOVE.

## 2017-03-15 ENCOUNTER — Other Ambulatory Visit: Payer: Self-pay | Admitting: Orthopedic Surgery

## 2017-03-21 ENCOUNTER — Encounter (HOSPITAL_BASED_OUTPATIENT_CLINIC_OR_DEPARTMENT_OTHER): Payer: Self-pay | Admitting: *Deleted

## 2017-03-29 ENCOUNTER — Ambulatory Visit (HOSPITAL_BASED_OUTPATIENT_CLINIC_OR_DEPARTMENT_OTHER): Payer: 59 | Admitting: Anesthesiology

## 2017-03-29 ENCOUNTER — Ambulatory Visit (HOSPITAL_BASED_OUTPATIENT_CLINIC_OR_DEPARTMENT_OTHER)
Admission: RE | Admit: 2017-03-29 | Discharge: 2017-03-29 | Disposition: A | Discharge status: Home / Self Care | Payer: 59 | Source: Ambulatory Visit | Attending: Orthopedic Surgery | Admitting: Orthopedic Surgery

## 2017-03-29 ENCOUNTER — Encounter (HOSPITAL_BASED_OUTPATIENT_CLINIC_OR_DEPARTMENT_OTHER): Payer: Self-pay

## 2017-03-29 ENCOUNTER — Encounter (HOSPITAL_BASED_OUTPATIENT_CLINIC_OR_DEPARTMENT_OTHER)
Admission: RE | Disposition: A | Discharge status: Home / Self Care | Payer: Self-pay | Source: Ambulatory Visit | Attending: Orthopedic Surgery

## 2017-03-29 DIAGNOSIS — M654 Radial styloid tenosynovitis [de Quervain]: Secondary | ICD-10-CM | POA: Diagnosis present

## 2017-03-29 DIAGNOSIS — Z79899 Other long term (current) drug therapy: Secondary | ICD-10-CM | POA: Diagnosis not present

## 2017-03-29 DIAGNOSIS — J45909 Unspecified asthma, uncomplicated: Secondary | ICD-10-CM | POA: Diagnosis not present

## 2017-03-29 HISTORY — PX: DORSAL COMPARTMENT RELEASE: SHX5039

## 2017-03-29 HISTORY — DX: Presence of spectacles and contact lenses: Z97.3

## 2017-03-29 HISTORY — DX: Encounter for adoption services: Z02.82

## 2017-03-29 HISTORY — DX: Other specified postprocedural states: Z98.890

## 2017-03-29 HISTORY — DX: Other seasonal allergic rhinitis: J30.2

## 2017-03-29 HISTORY — DX: Nausea with vomiting, unspecified: R11.2

## 2017-03-29 HISTORY — DX: Radial styloid tenosynovitis (de quervain): M65.4

## 2017-03-29 HISTORY — DX: Personal history of other complications of pregnancy, childbirth and the puerperium: Z87.59

## 2017-03-29 HISTORY — DX: Unspecified osteoarthritis, unspecified site: M19.90

## 2017-03-29 HISTORY — DX: Other specified health status: Z78.9

## 2017-03-29 LAB — POCT HEMOGLOBIN-HEMACUE: Hemoglobin: 13.9 g/dL (ref 12.0–15.0)

## 2017-03-29 LAB — POCT PREGNANCY, URINE: PREG TEST UR: NEGATIVE

## 2017-03-29 SURGERY — RELEASE, FIRST DORSAL COMPARTMENT, HAND
Anesthesia: General | Laterality: Right

## 2017-03-29 MED ORDER — HYDROCODONE-ACETAMINOPHEN 5-325 MG PO TABS: 1.0000 | ORAL_TABLET | Freq: Four times a day (QID) | ORAL | 0 refills | Status: AC | PRN

## 2017-03-29 MED ORDER — METOCLOPRAMIDE HCL 5 MG/ML IJ SOLN
INTRAMUSCULAR | Status: AC
  Filled 2017-03-29: qty 2

## 2017-03-29 MED ORDER — FENTANYL CITRATE (PF) 100 MCG/2ML IJ SOLN
25.0000 ug | INTRAMUSCULAR | Status: DC | PRN
  Filled 2017-03-29: qty 1

## 2017-03-29 MED ORDER — KETOROLAC TROMETHAMINE 30 MG/ML IJ SOLN
INTRAMUSCULAR | Status: DC | PRN
  Administered 2017-03-29: 30 mg via INTRAVENOUS

## 2017-03-29 MED ORDER — PROPOFOL 10 MG/ML IV BOLUS
INTRAVENOUS | Status: AC
  Filled 2017-03-29: qty 40

## 2017-03-29 MED ORDER — MIDAZOLAM HCL 5 MG/5ML IJ SOLN
INTRAMUSCULAR | Status: DC | PRN
  Administered 2017-03-29: 2 mg via INTRAVENOUS

## 2017-03-29 MED ORDER — ONDANSETRON 4 MG PO TBDP: 4.0000 mg | ORAL_TABLET | Freq: Three times a day (TID) | ORAL | 0 refills | Status: AC | PRN

## 2017-03-29 MED ORDER — CHLORHEXIDINE GLUCONATE 4 % EX LIQD
60.0000 mL | Freq: Once | CUTANEOUS | Status: DC
  Filled 2017-03-29: qty 118

## 2017-03-29 MED ORDER — DEXAMETHASONE SODIUM PHOSPHATE 4 MG/ML IJ SOLN
INTRAMUSCULAR | Status: DC | PRN
  Administered 2017-03-29: 10 mg via INTRAVENOUS

## 2017-03-29 MED ORDER — CEFAZOLIN SODIUM-DEXTROSE 2-4 GM/100ML-% IV SOLN
2.0000 g | INTRAVENOUS | Status: AC
  Administered 2017-03-29: 2 g via INTRAVENOUS
  Filled 2017-03-29: qty 100

## 2017-03-29 MED ORDER — LACTATED RINGERS IV SOLN
INTRAVENOUS | Status: DC
  Administered 2017-03-29: 12:00:00 via INTRAVENOUS
  Filled 2017-03-29: qty 1000

## 2017-03-29 MED ORDER — HYDROCODONE-ACETAMINOPHEN 7.5-325 MG PO TABS
1.0000 | ORAL_TABLET | Freq: Once | ORAL | Status: DC | PRN
  Filled 2017-03-29: qty 1

## 2017-03-29 MED ORDER — MIDAZOLAM HCL 2 MG/2ML IJ SOLN
INTRAMUSCULAR | Status: AC
  Filled 2017-03-29: qty 2

## 2017-03-29 MED ORDER — LIDOCAINE HCL (CARDIAC) 20 MG/ML IV SOLN
INTRAVENOUS | Status: DC | PRN
Start: 2017-03-29 — End: 2017-03-29
  Administered 2017-03-29: 100 mg via INTRAVENOUS

## 2017-03-29 MED ORDER — ONDANSETRON HCL 4 MG/2ML IJ SOLN
INTRAMUSCULAR | Status: DC | PRN
  Administered 2017-03-29: 4 mg via INTRAVENOUS

## 2017-03-29 MED ORDER — METOCLOPRAMIDE HCL 5 MG/ML IJ SOLN
INTRAMUSCULAR | Status: DC | PRN
  Administered 2017-03-29: 10 mg via INTRAVENOUS

## 2017-03-29 MED ORDER — CEFAZOLIN SODIUM-DEXTROSE 2-4 GM/100ML-% IV SOLN
INTRAVENOUS | Status: AC
  Filled 2017-03-29: qty 100

## 2017-03-29 MED ORDER — FENTANYL CITRATE (PF) 100 MCG/2ML IJ SOLN
INTRAMUSCULAR | Status: AC
  Filled 2017-03-29: qty 2

## 2017-03-29 MED ORDER — FENTANYL CITRATE (PF) 100 MCG/2ML IJ SOLN
INTRAMUSCULAR | Status: DC | PRN
  Administered 2017-03-29: 25 ug via INTRAVENOUS
  Administered 2017-03-29: 75 ug via INTRAVENOUS

## 2017-03-29 MED ORDER — PROPOFOL 10 MG/ML IV BOLUS
INTRAVENOUS | Status: DC | PRN
  Administered 2017-03-29: 100 mg via INTRAVENOUS
  Administered 2017-03-29: 300 mg via INTRAVENOUS

## 2017-03-29 MED ORDER — BUPIVACAINE HCL 0.25 % IJ SOLN
INTRAMUSCULAR | Status: DC | PRN
  Administered 2017-03-29: 10 mL

## 2017-03-29 SURGICAL SUPPLY — 39 items
BANDAGE ACE 3X5.8 VEL STRL LF (GAUZE/BANDAGES/DRESSINGS) ×3 IMPLANT
BLADE SURG 15 STRL LF DISP TIS (BLADE) ×1 IMPLANT
BLADE SURG 15 STRL SS (BLADE) ×3
BNDG CMPR 9X4 STRL LF SNTH (GAUZE/BANDAGES/DRESSINGS) ×1
BNDG CONFORM 3 STRL LF (GAUZE/BANDAGES/DRESSINGS) ×3 IMPLANT
BNDG ESMARK 4X9 LF (GAUZE/BANDAGES/DRESSINGS) ×3 IMPLANT
CORDS BIPOLAR (ELECTRODE) ×3 IMPLANT
COVER BACK TABLE 60X90IN (DRAPES) ×3 IMPLANT
CUFF TOURN SGL QUICK 18 (TOURNIQUET CUFF) ×3 IMPLANT
DECANTER SPIKE VIAL GLASS SM (MISCELLANEOUS) ×3 IMPLANT
DRAPE EXTREMITY T 121X128X90 (DRAPE) ×3 IMPLANT
DRAPE LG THREE QUARTER DISP (DRAPES) ×3 IMPLANT
DRAPE SURG 17X23 STRL (DRAPES) ×3 IMPLANT
DURAPREP 26ML APPLICATOR (WOUND CARE) ×3 IMPLANT
ELECT REM PT RETURN 9FT ADLT (ELECTROSURGICAL) ×3
ELECTRODE REM PT RTRN 9FT ADLT (ELECTROSURGICAL) ×1 IMPLANT
GAUZE SPONGE 4X4 12PLY STRL LF (GAUZE/BANDAGES/DRESSINGS) ×2 IMPLANT
GAUZE XEROFORM 1X8 LF (GAUZE/BANDAGES/DRESSINGS) ×3 IMPLANT
GLOVE BIO SURGEON STRL SZ7.5 (GLOVE) ×3 IMPLANT
GLOVE BIOGEL PI IND STRL 8 (GLOVE) IMPLANT
GLOVE BIOGEL PI INDICATOR 8 (GLOVE)
GOWN STRL REUS W/TWL LRG LVL3 (GOWN DISPOSABLE) IMPLANT
GOWN STRL REUS W/TWL XL LVL3 (GOWN DISPOSABLE) ×3 IMPLANT
NEEDLE HYPO 22GX1.5 SAFETY (NEEDLE) ×3 IMPLANT
NS IRRIG 500ML POUR BTL (IV SOLUTION) ×3 IMPLANT
PACK BASIN DAY SURGERY FS (CUSTOM PROCEDURE TRAY) ×3 IMPLANT
PAD CAST 3X4 CTTN HI CHSV (CAST SUPPLIES) ×1 IMPLANT
PADDING CAST COTTON 3X4 STRL (CAST SUPPLIES) ×3
SPLINT FIBERGLASS 3X35 (CAST SUPPLIES) ×3 IMPLANT
SPONGE GAUZE 4X4 12PLY STER LF (GAUZE/BANDAGES/DRESSINGS) ×6 IMPLANT
STOCKINETTE 4X48 STRL (DRAPES) ×3 IMPLANT
SUT ETHILON 3 0 PS 1 (SUTURE) ×3 IMPLANT
SUT ETHILON 4 0 PS 2 18 (SUTURE) IMPLANT
SUT MON AB 2-0 SH 27 (SUTURE) ×3
SUT MON AB 2-0 SH27 (SUTURE) IMPLANT
SYR BULB 3OZ (MISCELLANEOUS) ×3 IMPLANT
SYR CONTROL 10ML LL (SYRINGE) ×3 IMPLANT
TOWEL OR 17X24 6PK STRL BLUE (TOWEL DISPOSABLE) ×6 IMPLANT
WATER STERILE IRR 500ML POUR (IV SOLUTION) IMPLANT

## 2017-03-29 NOTE — Transfer of Care (Signed)
Immediate Anesthesia Transfer of Care Note  Patient: Christina Tucker  Procedure(s) Performed: Procedure(s): Right wrist 1st dorsal compartment release (Right)  Patient Location: PACU  Anesthesia Type:General  Level of Consciousness: awake, alert , oriented and patient cooperative  Airway & Oxygen Therapy: Patient Spontanous Breathing and Patient connected to nasal cannula oxygen  Post-op Assessment: Report given to RN and Post -op Vital signs reviewed and stable  Post vital signs: Reviewed and stable  Last Vitals:  Vitals:   03/29/17 1103  BP: 140/85  Pulse: 77  Resp: 20  Temp: 37 C    Last Pain:  Vitals:   03/29/17 1126  TempSrc:   PainSc: 5       Patients Stated Pain Goal: 5 (03/29/17 1126)  Complications: No apparent anesthesia complications

## 2017-03-29 NOTE — Anesthesia Procedure Notes (Signed)
Procedure Name: LMA Insertion Date/Time: 03/29/2017 1:27 PM Performed by: Tyrone Nine Pre-anesthesia Checklist: Patient identified, Emergency Drugs available, Suction available, Patient being monitored and Timeout performed Patient Re-evaluated:Patient Re-evaluated prior to inductionOxygen Delivery Method: Circle system utilized Preoxygenation: Pre-oxygenation with 100% oxygen Intubation Type: IV induction Ventilation: Mask ventilation without difficulty LMA: LMA inserted LMA Size: 4.0 Number of attempts: 1 Placement Confirmation: positive ETCO2 and breath sounds checked- equal and bilateral Tube secured with: Tape

## 2017-03-29 NOTE — Op Note (Signed)
DATE OF SURGERY: 03/29/2017  PREOPERATIVE DIAGNOSES:  1. 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 (ICD-10, M65.4)  POSTOPERATIVE DIAGNOSES:  1. The same  PROCEDURES:  1. Right first dorsal extensor compartment retinaculum release (CPT-25000).   SURGEON: Surgeon(s) and Role:    * Nicholes Stairs, MD - Primary  ANESTHESIA: MAC with local.   TOURNIQUET TIME:  Total Tourniquet Time Documented: Upper Arm (Right) - 14 minutes Total: Upper Arm (Right) - 14 minutes   BLOOD LOSS: Minimal.   COMPLICATIONS: None.   PATHOLOGY: None.   TIME OUT: A time out was performed before the procedure started.   INDICATIONS: The patient is a 31 y.o. female who presented with recalcitrant de Quervain's tenosynovitis and was indicated for surgery.  She had symptoms during her pregnancy of bilateral wrist.  We have been treating her for the last 6 months and she has failed 2 steroid injections, PT and bracing.  We did discuss the risks, benefits and indications of the procedure at length and elected to proceed.  DESCRIPTION OF PROCEDURE:  The patient was seen in the preop holding room, the operative site, consent form and indications were reviewed with the patient and her mother.  The right upper extremity was marked by me.  The patient was placed in the supine position. Prophylactic antibiotics were administered prior to incision.  A well padded tourniquet was inflated. MAC anesthesia was placed by the Anesthesia team.  The extremity was prepped and draped in standard sterile fashion.  A radially based incision over the radial styloid was used.  The superficial radial nerve was identified, mobilized and protected. Then the distal edge of the first dorsal extensor retinaculum was identified. Next the hypertrophic tenosynovium overlying the APL and EPB just distal to the retinaculum was excised.  The retinaculum was released over its dorsal edge. The extensor pollicis brevis tendon  was identified. There was a separate thick septum in between the EPB and abductor pollicis longus tendons. The septum was excised. The abductor pollicis longus tendon slips were examined. All tendons were freed from the compression. The wrist was ranged through full motion and no bowstring effect was witnessed.  At this point, local infiltration with 0.25% of Sensorcaine was given. Tourniquet was released. Hemostasis achieved. Wound was irrigated. Incision closed using 2-0 monocryl for the deep layer and 4-0 nylon sutures. Sterile bulky dressing was applied. The patient was transferred to recovery room in stable condition after all counts were correct. There were no complications.  POSTOPERATIVE PLAN: To start thumb range of motion exercises and wear brace for two weeks, avoid heavy lifting, pushing, pinching for four weeks.

## 2017-03-29 NOTE — Discharge Instructions (Signed)

## 2017-03-29 NOTE — Anesthesia Preprocedure Evaluation (Signed)
Anesthesia Evaluation  Patient identified by MRN, date of birth, ID band Patient awake    Reviewed: Allergy & Precautions, H&P , NPO status , Patient's Chart, lab work & pertinent test results  History of Anesthesia Complications (+) PONV  Airway Mallampati: II  TM Distance: >3 FB Neck ROM: full    Dental no notable dental hx. (+) Teeth Intact   Pulmonary asthma ,    Pulmonary exam normal breath sounds clear to auscultation       Cardiovascular Normal cardiovascular exam Rhythm:regular Rate:Normal     Neuro/Psych negative neurological ROS  negative psych ROS   GI/Hepatic negative GI ROS, Neg liver ROS,   Endo/Other  negative endocrine ROS  Renal/GU negative Renal ROS  negative genitourinary   Musculoskeletal   Abdominal   Peds  Hematology negative hematology ROS (+)   Anesthesia Other Findings   Reproductive/Obstetrics                             Anesthesia Physical Anesthesia Plan  ASA: II  Anesthesia Plan: General   Post-op Pain Management:    Induction: Intravenous  Airway Management Planned: LMA  Additional Equipment: None  Intra-op Plan:   Post-operative Plan: Extubation in OR  Informed Consent: I have reviewed the patients History and Physical, chart, labs and discussed the procedure including the risks, benefits and alternatives for the proposed anesthesia with the patient or authorized representative who has indicated his/her understanding and acceptance.   Dental advisory given  Plan Discussed with: CRNA and Surgeon  Anesthesia Plan Comments:         Anesthesia Quick Evaluation

## 2017-03-29 NOTE — H&P (Signed)
ORTHOPAEDIC CONSULTATION  REQUESTING PHYSICIAN: Yolonda Kida, MD  PCP:  No PCP Per Patient  Chief Complaint: Right wrist De Quervain's tenosynovitis  HPI: Christina Tucker is a 31 y.o. female who complains of right radial sided wrist pain for 6 months now.  She has failed 2 injections, PT and bracing and is here today for operative intervention.  No new complaints at this time, other than pain in the left wrist, which we have also been treating for the same.  Past Medical History:  Diagnosis Date  . Adopted    unknown medical hx  . Arthritis    back  . Asthma   . De Quervain's tenosynovitis, right   . History of gestational hypertension   . PONV (postoperative nausea and vomiting)   . Seasonal allergies   . Wears contact lenses    Past Surgical History:  Procedure Laterality Date  . ANKLE SURGERY Left 1998  approx.  . APPENDECTOMY  2005  . CESAREAN SECTION N/A 09/20/2016   Procedure: CESAREAN SECTION;  Surgeon: Carrington Clamp, MD;  [facility];  Service: Obstetrics;  Laterality: N/A;  . KNEE ARTHROSCOPY W/ MENISCECTOMY Right 2011  . LUMBAR MICRODISCECTOMY  12/23/2004   L4 -- L5  . TONSILLECTOMY AND ADENOIDECTOMY  2007  . WISDOM TOOTH EXTRACTION  2005   Social History   Social History  . Marital status: Married    Spouse name: N/A  . Number of children: N/A  . Years of education: N/A   Social History Main Topics  . Smoking status: Never Smoker  . Smokeless tobacco: Never Used  . Alcohol use No  . Drug use: No  . Sexual activity: Yes    Birth control/ protection: IUD     Comment: Mirena IUD placed 11/ 2017 approx.   Other Topics Concern  . None   Social History Narrative  . None   History reviewed. No pertinent family history. Allergies  Allergen Reactions  . Banana Swelling and Other (See Comments)    Reaction:  Hives on tongue DURING PREGNANCY   . Imitrex [Sumatriptan Base] Other (See Comments)    Reaction:  Hives on  tongue   . Morphine And Related Other (See Comments)    Pt states that she does not have the receptors for this medication.    Raylene Miyamoto [Tapentadol] Itching  . Oxycodone Itching   Prior to Admission medications   Medication Sig Start Date End Date Taking? Authorizing Provider  Ibuprofen-Famotidine (DUEXIS) 800-26.6 MG TABS Take 1 tablet by mouth 3 (three) times daily as needed.   Yes Historical Provider, MD  Multiple Vitamins-Minerals (HAIR/SKIN/NAILS) CAPS Take 3 capsules by mouth daily.   Yes Historical Provider, MD  ALBUTEROL IN Inhale into the lungs as needed.    Historical Provider, MD  cetirizine (ZYRTEC) 10 MG tablet Take 10 mg by mouth daily as needed for allergies.    Historical Provider, MD   No results found.  Positive ROS: All other systems have been reviewed and were otherwise negative with the exception of those mentioned in the HPI and as above.  Physical Exam: General: Alert, no acute distress Cardiovascular: No pedal edema Respiratory: No cyanosis, no use of accessory musculature GI: No organomegaly, abdomen is soft and non-tender Skin: No lesions in the area of chief complaint Neurologic: Sensation intact distally Psychiatric: Patient is competent for consent with normal mood and affect Lymphatic: No axillary or cervical lymphadenopathy  Right wrist-  No overlying skin changes, TTp over  radial styloid, positive Finklestein's maneuver, otherwise SILT m/r/u, motor intact ain/pin/m/u/r.  2+ radial pulse  Assessment: Right wrist De Quervain's syndrome  Plan: -plan for operative treatment with open release of the first dorsal compartment -The risks, benefits, and alternatives were discussed with the patient. There are risks associated with the surgery including, but not limited to, problems with anesthesia (death), infection, hematoma (blood accumulation) which may require surgical drainage, blood clots, pulmonary embolism, nerve injury, and blood vessel injury. The  patient understands these risks and elects to proceed. -DC home from PACU     Yolonda Kida, MD Cell [phone]    03/29/2017 12:44 PM

## 2017-03-30 NOTE — Anesthesia Postprocedure Evaluation (Addendum)
Anesthesia Post Note  Patient: Christina Tucker  Procedure(s) Performed: Procedure(s) (LRB): Right wrist 1st dorsal compartment release (Right)  Patient location during evaluation: PACU Anesthesia Type: General and Combined General/Spinal Level of consciousness: awake and alert Pain management: pain level controlled Vital Signs Assessment: post-procedure vital signs reviewed and stable Respiratory status: spontaneous breathing, nonlabored ventilation, respiratory function stable and patient connected to nasal cannula oxygen Cardiovascular status: blood pressure returned to baseline and stable Postop Assessment: no signs of nausea or vomiting Anesthetic complications: no       Last Vitals:  Vitals:   03/29/17 1500 03/29/17 1535  BP: 111/69 (!) 142/78  Pulse: 73 90  Resp: (!) 0 16  Temp:  36.9 C    Last Pain:  Vitals:   03/29/17 1500  TempSrc:   PainSc: 0-No pain                 Kaisen Ackers

## 2017-03-31 ENCOUNTER — Encounter (HOSPITAL_BASED_OUTPATIENT_CLINIC_OR_DEPARTMENT_OTHER): Payer: Self-pay | Admitting: Orthopedic Surgery

## 2017-05-22 NOTE — Addendum Note (Signed)
Addendum  created 05/22/17 1341 by Darrien Belter, MD   Sign clinical note    

## 2020-11-05 ENCOUNTER — Emergency Department (INDEPENDENT_AMBULATORY_CARE_PROVIDER_SITE_OTHER)
Admission: RE | Admit: 2020-11-05 | Discharge: 2020-11-05 | Disposition: A | Discharge status: Home / Self Care | Payer: 59 | Source: Ambulatory Visit

## 2020-11-05 ENCOUNTER — Other Ambulatory Visit: Payer: Self-pay

## 2020-11-05 VITALS — BP 148/86 | HR 78 | Temp 98.7°F | Resp 16 | Ht 62.0 in | Wt 205.0 lb

## 2020-11-05 DIAGNOSIS — R059 Cough, unspecified: Secondary | ICD-10-CM

## 2020-11-05 DIAGNOSIS — R0989 Other specified symptoms and signs involving the circulatory and respiratory systems: Secondary | ICD-10-CM

## 2020-11-05 DIAGNOSIS — U071 COVID-19: Secondary | ICD-10-CM

## 2020-11-05 DIAGNOSIS — Z8709 Personal history of other diseases of the respiratory system: Secondary | ICD-10-CM

## 2020-11-05 LAB — POC SARS CORONAVIRUS 2 AG -  ED: SARS Coronavirus 2 Ag: POSITIVE — AB

## 2020-11-05 MED ORDER — PREDNISONE 20 MG PO TABS: ORAL_TABLET | ORAL | 0 refills | Status: AC

## 2020-11-05 NOTE — ED Provider Notes (Signed)
[facility]    CSN: [id number] Arrival date & time: 11/05/20  1553      History   Chief Complaint Chief Complaint  Patient presents with  . Appointment    4:00  . Nasal Congestion    HPI Christina Tucker is a 34 y.o. female.   HPI Christina Tucker is a 34 y.o. female presenting to UC with c/o mild nasal congestion, scratchy throat and cough that is worse at night for the last 3 days. Hx of asthma. She notes her 4yo daughter had a more congested cough than usual so she decided to take a home COVID test. One test was negative 3 days ago but she took another test today after losing taste and smell, test was POSITIVE.  She has used her albuterol inhaler more than usual the last 3 days but denies chest pain or SOB at this time. Denies fever, chills, n/v/d. She received the Pfizer COVID vaccine in June 2021. Pt notes she is concerned about her elderly parents who have health problems. They have been vaccinated with Pifzer in March 2021.  They placed a call to her parents' PCP but waiting to hear back on recommendations for them.    Past Medical History:  Diagnosis Date  . Adopted    unknown medical hx  . Arthritis    back  . Asthma   . De Quervain's tenosynovitis, right   . History of gestational hypertension   . PONV (postoperative nausea and vomiting)   . Seasonal allergies   . Wears contact lenses     Patient Active Problem List   Diagnosis Date Noted  . De Quervain's disease (radial styloid tenosynovitis), right 03/29/2017  . Postoperative state 09/20/2016  . Status post induction of labor 09/19/2016    Past Surgical History:  Procedure Laterality Date  . ANKLE SURGERY Left 1998  approx.  . APPENDECTOMY  2005  . CESAREAN SECTION N/A 09/20/2016   Procedure: CESAREAN SECTION;  Surgeon: Carrington ClampMichelle Horvath, MD;  [facility];  Service: Obstetrics;  Laterality: N/A;  . DORSAL COMPARTMENT RELEASE Right 03/29/2017   Procedure: Right  wrist 1st dorsal compartment release;  Surgeon: Yolonda KidaJason Patrick Rogers, MD;  [facility];  Service: Orthopedics;  Laterality: Right;  . KNEE ARTHROSCOPY W/ MENISCECTOMY Right 2011  . LUMBAR MICRODISCECTOMY  12/23/2004   L4 -- L5  . TONSILLECTOMY AND ADENOIDECTOMY  2007  . WISDOM TOOTH EXTRACTION  2005    OB History    Gravida  1   Para  1   Term  1   Preterm      AB      Living  1     SAB      TAB      Ectopic      Multiple  0   Live Births  1            Home Medications    Prior to Admission medications   Medication Sig Start Date End Date Taking? Authorizing Provider  montelukast (SINGULAIR) 10 MG tablet Take 10 mg by mouth at bedtime.   Yes [provider]  ALBUTEROL IN Inhale into the lungs as needed.    [provider]  cetirizine (ZYRTEC) 10 MG tablet Take 10 mg by mouth daily as needed for allergies.    [provider]  HYDROcodone-acetaminophen (NORCO) 5-325 MG tablet Take 1-2 tablets by mouth every 6 (six) hours as needed for moderate pain. 03/29/17  Yolonda Kida, MD  Ibuprofen-Famotidine (DUEXIS) 800-26.6 MG TABS Take 1 tablet by mouth 3 (three) times daily as needed.    [provider]  Multiple Vitamins-Minerals (HAIR/SKIN/NAILS) CAPS Take 3 capsules by mouth daily.    [provider]  ondansetron (ZOFRAN ODT) 4 MG disintegrating tablet Take 1 tablet (4 mg total) by mouth every 8 (eight) hours as needed for nausea or vomiting. 03/29/17   Yolonda Kida, MD  predniSONE (DELTASONE) 20 MG tablet 3 tabs po daily x 3 days, then 2 tabs x 3 days, then 1.5 tabs x 3 days, then 1 tab x 3 days, then 0.5 tabs x 3 days 11/05/20   Lurene Shadow, PA-C    Family History Family History  Adopted: Yes    Social History Social History   Tobacco Use  . Smoking status: Never Smoker  . Smokeless tobacco: Never Used  Substance Use Topics  . Alcohol use: No  . Drug use: No      Allergies   Imitrex [sumatriptan base], Morphine and related, Nucynta [tapentadol], and Oxycodone   Review of Systems Review of Systems  Constitutional: Negative for chills and fever.  HENT: Positive for congestion and sore throat (scratchy). Negative for ear pain, trouble swallowing and voice change.   Respiratory: Positive for cough. Negative for shortness of breath.   Cardiovascular: Negative for chest pain and palpitations.  Gastrointestinal: Negative for abdominal pain, diarrhea, nausea and vomiting.  Musculoskeletal: Negative for arthralgias, back pain and myalgias.  Skin: Negative for rash.  All other systems reviewed and are negative.    Physical Exam Triage Vital Signs ED Triage Vitals  Enc Vitals Group     BP 11/05/20 1607 (!) 148/86     Pulse Rate 11/05/20 1607 78     Resp 11/05/20 1607 16     Temp 11/05/20 1607 98.7 F (37.1 C)     Temp Source 11/05/20 1607 Oral     SpO2 11/05/20 1607 99 %     Weight --      Height --      Head Circumference --      Peak Flow --      Pain Score 11/05/20 1605 0     Pain Loc --      Pain Edu? --      Excl. in GC? --    No data found.  Updated Vital Signs BP (!) 148/86 (BP Location: Right Arm)   Pulse 78   Temp 98.7 F (37.1 C) (Oral)   Resp 16   Ht 5\' 2"  (1.575 m)   Wt 205 lb (93 kg)   SpO2 99%   BMI 37.49 kg/m   Visual Acuity Right Eye Distance:   Left Eye Distance:   Bilateral Distance:    Right Eye Near:   Left Eye Near:    Bilateral Near:     Physical Exam Vitals and nursing note reviewed.  Constitutional:      General: She is not in acute distress.    Appearance: Normal appearance. She is well-developed. She is not ill-appearing, toxic-appearing or diaphoretic.  HENT:     Head: Normocephalic and atraumatic.     Right Ear: Tympanic membrane and ear canal normal.     Left Ear: Tympanic membrane and ear canal normal.     Nose: Nose normal.     Right Sinus: No maxillary sinus tenderness or  frontal sinus tenderness.     Left Sinus: No maxillary sinus tenderness or  frontal sinus tenderness.     Mouth/Throat:     Lips: Pink.     Mouth: Mucous membranes are moist.     Pharynx: Oropharynx is clear. Uvula midline. No pharyngeal swelling, oropharyngeal exudate, posterior oropharyngeal erythema or uvula swelling.  Cardiovascular:     Rate and Rhythm: Normal rate and regular rhythm.  Pulmonary:     Effort: Pulmonary effort is normal. No respiratory distress.     Breath sounds: Normal breath sounds. No stridor. No wheezing, rhonchi or rales.  Musculoskeletal:        General: Normal range of motion.     Cervical back: Normal range of motion and neck supple. No tenderness.  Lymphadenopathy:     Cervical: No cervical adenopathy.  Skin:    General: Skin is warm and dry.  Neurological:     Mental Status: She is alert and oriented to person, place, and time.  Psychiatric:        Behavior: Behavior normal.      UC Treatments / Results  Labs (all labs ordered are listed, but only abnormal results are displayed) Labs Reviewed  POC SARS CORONAVIRUS 2 AG -  ED - Abnormal; Notable for the following components:      Result Value   SARS Coronavirus 2 Ag Positive (*)    All other components within normal limits    EKG   Radiology No results found.  Procedures Procedures (including critical care time)  Medications Ordered in UC Medications - No data to display  Initial Impression / Assessment and Plan / UC Course  I have reviewed the triage vital signs and the nursing notes.  Pertinent labs & imaging results that were available during my care of the patient were reviewed by me and considered in my medical decision making (see chart for details).     Rapid COVID: Positive Vitals: WNL Lungs: CTAB Hx of asthma, has needed prednisone for asthma flares in the past. Prescription given to hold. May start if asthma flares up more. Referral to COVID-19 monoclonal antibody  treatment infusion clinic placed. Discussed symptoms that warrant emergent care in the ED.   Final Clinical Impressions(s) / UC Diagnoses   Final diagnoses:  COVID  Cough  Chest congestion  History of asthma     Discharge Instructions      Please inform your family, especially your household and close contacts you have been diagnosed with Covid.  Everyone in your house should isolate at home and act as if they too have Covid, especially if they have symptoms. If you MUST go out, always wear a facemask and keep your distance from others. Isolate for 10 days from symptom onset and 24 hours after fever resolves without use of a fever reducing medication to help prevent worsening community spread of the virus.  A referral has been placed for you with the monoclonal antibody treatment clinic.  You should receive a call within 24 hours to discuss and schedule this treatment.  The infusion center is located at [facility] in [place] from 8am-2pm on Tuesdays, Thursdays and Saturdays.   Call 911 or go to the closest hospital if you develop worsening trouble breathing, dizziness/passing out, unable to keep down fluids, or other new concerning symptoms develop.      ED Prescriptions    Medication Sig Dispense Auth. Provider   predniSONE (DELTASONE) 20 MG tablet 3 tabs po daily x 3 days, then 2 tabs x 3 days, then 1.5 tabs x 3 days, then  1 tab x 3 days, then 0.5 tabs x 3 days 27 tablet Lurene Shadow, PA-C     PDMP not reviewed this encounter.   Lurene Shadow, PA-C 11/05/20 1745

## 2020-11-05 NOTE — ED Triage Notes (Signed)
Patient presents to Urgent Care with complaints of nasal congestion and scratchy throat since three days ago. Patient reports she took an at-home covid test and it was positive today, pulmonologist suggested she come here for some kind of treatment because she is high-risk w/ asthma. Has been vaccinated for covid. In NAD at this time.

## 2020-11-05 NOTE — Discharge Instructions (Signed)
°  Please inform your family, especially your household and close contacts you have been diagnosed with Covid.  Everyone in your house should isolate at home and act as if they too have Covid, especially if they have symptoms. If you MUST go out, always wear a facemask and keep your distance from others. Isolate for 10 days from symptom onset and 24 hours after fever resolves without use of a fever reducing medication to help prevent worsening community spread of the virus.  A referral has been placed for you with the monoclonal antibody treatment clinic.  You should receive a call within 24 hours to discuss and schedule this treatment.  The infusion center is located at [facility] in [place] from 8am-2pm on Tuesdays, Thursdays and Saturdays.   Call 911 or go to the closest hospital if you develop worsening trouble breathing, dizziness/passing out, unable to keep down fluids, or other new concerning symptoms develop.

## 2020-11-06 ENCOUNTER — Telehealth: Payer: Self-pay | Admitting: Nurse Practitioner

## 2020-11-06 ENCOUNTER — Telehealth (HOSPITAL_COMMUNITY): Payer: Self-pay

## 2020-11-06 NOTE — Telephone Encounter (Signed)
COVID MAB Infusion Contact Note   Qualifiers: BMI Symptoms: LoT, Asthma exacerbation Symptom Onset: 11/15   Contact via telephone to discuss symptoms and evaluate for interest and qualifications for MAB Infusion treatment. Patient qualifies for at-risk group according to the FDA Emergency Use Authorization.   Unable to reach patient- unable to leave VM. 2nd attempt.  MyChart message sent with mAb information and call back number.      Shawna Clamp, DNP, AGNP-c COVID-19 M[facility] Infusion [facility] [phone]

## 2020-11-06 NOTE — Telephone Encounter (Signed)
Called to Discuss with patient about Covid symptoms and the use of the monoclonal antibody infusion for those with mild to moderate Covid symptoms and at a high risk of hospitalization.     Pt appears to qualify for this infusion due to co-morbid conditions and/or a member of an at-risk group in accordance with the FDA Emergency Use Authorization.   Risk factors include BMI >25.  No answer, left HIPPA complaint VM to return call to [phone].
